# Patient Record
Sex: Female | Born: 1945 | Race: White | Hispanic: No | Marital: Married | Smoking: Never smoker
Health system: Southern US, Community
[De-identification: ages and names within clinical notes are randomized; demographics above are authoritative.]

## PROBLEM LIST (undated history)

## (undated) DIAGNOSIS — T7840XA Allergy, unspecified, initial encounter: Secondary | ICD-10-CM

## (undated) DIAGNOSIS — B019 Varicella without complication: Secondary | ICD-10-CM

## (undated) DIAGNOSIS — H269 Unspecified cataract: Secondary | ICD-10-CM

## (undated) DIAGNOSIS — M1990 Unspecified osteoarthritis, unspecified site: Secondary | ICD-10-CM

## (undated) DIAGNOSIS — E7800 Pure hypercholesterolemia, unspecified: Secondary | ICD-10-CM

## (undated) DIAGNOSIS — R011 Cardiac murmur, unspecified: Secondary | ICD-10-CM

## (undated) DIAGNOSIS — J302 Other seasonal allergic rhinitis: Secondary | ICD-10-CM

## (undated) DIAGNOSIS — I10 Essential (primary) hypertension: Secondary | ICD-10-CM

## (undated) HISTORY — DX: Varicella without complication: B01.9

## (undated) HISTORY — DX: Essential (primary) hypertension: I10

## (undated) HISTORY — PX: AUGMENTATION MAMMAPLASTY: SUR837

## (undated) HISTORY — DX: Pure hypercholesterolemia, unspecified: E78.00

## (undated) HISTORY — DX: Other seasonal allergic rhinitis: J30.2

## (undated) HISTORY — DX: Unspecified cataract: H26.9

## (undated) HISTORY — DX: Cardiac murmur, unspecified: R01.1

## (undated) HISTORY — DX: Unspecified osteoarthritis, unspecified site: M19.90

## (undated) HISTORY — DX: Allergy, unspecified, initial encounter: T78.40XA

---

## 1967-03-23 HISTORY — PX: APPENDECTOMY: SHX54

## 1978-03-22 HISTORY — PX: BREAST ENHANCEMENT SURGERY: SHX7

## 2015-06-02 DIAGNOSIS — E7800 Pure hypercholesterolemia, unspecified: Secondary | ICD-10-CM | POA: Diagnosis not present

## 2015-06-02 DIAGNOSIS — I10 Essential (primary) hypertension: Secondary | ICD-10-CM | POA: Diagnosis not present

## 2015-06-26 DIAGNOSIS — Z1231 Encounter for screening mammogram for malignant neoplasm of breast: Secondary | ICD-10-CM | POA: Diagnosis not present

## 2015-07-07 DIAGNOSIS — D225 Melanocytic nevi of trunk: Secondary | ICD-10-CM | POA: Diagnosis not present

## 2015-08-26 DIAGNOSIS — M170 Bilateral primary osteoarthritis of knee: Secondary | ICD-10-CM | POA: Diagnosis not present

## 2015-12-02 DIAGNOSIS — R5383 Other fatigue: Secondary | ICD-10-CM | POA: Diagnosis not present

## 2015-12-02 DIAGNOSIS — E785 Hyperlipidemia, unspecified: Secondary | ICD-10-CM | POA: Diagnosis not present

## 2015-12-02 DIAGNOSIS — I10 Essential (primary) hypertension: Secondary | ICD-10-CM | POA: Diagnosis not present

## 2015-12-05 DIAGNOSIS — E785 Hyperlipidemia, unspecified: Secondary | ICD-10-CM | POA: Diagnosis not present

## 2015-12-05 DIAGNOSIS — Z0000 Encounter for general adult medical examination without abnormal findings: Secondary | ICD-10-CM | POA: Diagnosis not present

## 2016-01-19 DIAGNOSIS — J019 Acute sinusitis, unspecified: Secondary | ICD-10-CM | POA: Diagnosis not present

## 2016-01-19 DIAGNOSIS — J209 Acute bronchitis, unspecified: Secondary | ICD-10-CM | POA: Diagnosis not present

## 2016-01-28 DIAGNOSIS — H43393 Other vitreous opacities, bilateral: Secondary | ICD-10-CM | POA: Diagnosis not present

## 2016-01-28 DIAGNOSIS — H02831 Dermatochalasis of right upper eyelid: Secondary | ICD-10-CM | POA: Diagnosis not present

## 2016-01-28 DIAGNOSIS — H2513 Age-related nuclear cataract, bilateral: Secondary | ICD-10-CM | POA: Diagnosis not present

## 2016-01-28 DIAGNOSIS — H02834 Dermatochalasis of left upper eyelid: Secondary | ICD-10-CM | POA: Diagnosis not present

## 2016-01-28 DIAGNOSIS — H524 Presbyopia: Secondary | ICD-10-CM | POA: Diagnosis not present

## 2016-01-28 DIAGNOSIS — H52203 Unspecified astigmatism, bilateral: Secondary | ICD-10-CM | POA: Diagnosis not present

## 2016-03-26 DIAGNOSIS — I10 Essential (primary) hypertension: Secondary | ICD-10-CM | POA: Diagnosis not present

## 2016-03-26 DIAGNOSIS — Z23 Encounter for immunization: Secondary | ICD-10-CM | POA: Diagnosis not present

## 2016-05-26 DIAGNOSIS — Z1159 Encounter for screening for other viral diseases: Secondary | ICD-10-CM | POA: Diagnosis not present

## 2016-05-26 DIAGNOSIS — E7800 Pure hypercholesterolemia, unspecified: Secondary | ICD-10-CM | POA: Diagnosis not present

## 2016-05-26 DIAGNOSIS — Z0000 Encounter for general adult medical examination without abnormal findings: Secondary | ICD-10-CM | POA: Diagnosis not present

## 2016-06-01 DIAGNOSIS — I10 Essential (primary) hypertension: Secondary | ICD-10-CM | POA: Diagnosis not present

## 2016-06-01 DIAGNOSIS — Z9189 Other specified personal risk factors, not elsewhere classified: Secondary | ICD-10-CM | POA: Diagnosis not present

## 2016-06-01 DIAGNOSIS — E559 Vitamin D deficiency, unspecified: Secondary | ICD-10-CM | POA: Diagnosis not present

## 2016-06-01 DIAGNOSIS — E7800 Pure hypercholesterolemia, unspecified: Secondary | ICD-10-CM | POA: Diagnosis not present

## 2016-06-01 DIAGNOSIS — R739 Hyperglycemia, unspecified: Secondary | ICD-10-CM | POA: Diagnosis not present

## 2016-06-15 DIAGNOSIS — E559 Vitamin D deficiency, unspecified: Secondary | ICD-10-CM | POA: Diagnosis not present

## 2016-06-15 DIAGNOSIS — Z9189 Other specified personal risk factors, not elsewhere classified: Secondary | ICD-10-CM | POA: Diagnosis not present

## 2016-06-15 DIAGNOSIS — Z780 Asymptomatic menopausal state: Secondary | ICD-10-CM | POA: Diagnosis not present

## 2016-08-15 DIAGNOSIS — J209 Acute bronchitis, unspecified: Secondary | ICD-10-CM | POA: Diagnosis not present

## 2016-08-31 DIAGNOSIS — E7800 Pure hypercholesterolemia, unspecified: Secondary | ICD-10-CM | POA: Diagnosis not present

## 2016-08-31 DIAGNOSIS — I10 Essential (primary) hypertension: Secondary | ICD-10-CM | POA: Diagnosis not present

## 2016-08-31 DIAGNOSIS — R739 Hyperglycemia, unspecified: Secondary | ICD-10-CM | POA: Diagnosis not present

## 2016-09-07 DIAGNOSIS — I10 Essential (primary) hypertension: Secondary | ICD-10-CM | POA: Diagnosis not present

## 2016-09-07 DIAGNOSIS — E7800 Pure hypercholesterolemia, unspecified: Secondary | ICD-10-CM | POA: Diagnosis not present

## 2017-02-19 NOTE — Progress Notes (Addendum)
[facility] [address], [address] [place], [place] [id number] [phone] [phone]  Date:  02/21/2017   Name:  Jennifer Gonzalez   DOB:  [birth year]   MRN:  [id number]  PCP:  Pearline Cablesopland, Jessica C, MD    Chief Complaint: Establish Care (Pt here to est care. )   History of Present Illness:  Jennifer Gellnna Christensen is a 71 y.o. 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  She developed natural immunity.   She has been tested for Hep C and was negative  mammo- she is scheduled for tomorrow She declines colon cancer screening- she has never had this done  She did do a bone density last year- looked ok  She thinks her last tetanus was likely 10 years ago- she might like to update today She did do her pneumonia series already   She does not see GYN any longer Never had an abnl pap No post- menopausal bleeding   She has noted her BP creeping She does check her BP at home and may run 120s/ 70s  She last had labs in June- so far today she ate an egg and a piece of baccon  She has lost about 60 lbs by going low carb and kept it off for the last 6 years.    Her main problem is OA- in both knees and her feet. More in the left foot   Patient Active Problem List   Diagnosis Date Noted  . Essential hypertension 02/21/2017  . Mixed hyperlipidemia 02/21/2017   . Primary osteoarthritis involving multiple joints 02/21/2017    No past medical history on file.    Social History   Tobacco Use  . Smoking status: Not on file  Substance Use Topics  . Alcohol use: Not on file  . Drug use: Not on file    No family history on file.  Allergies  Allergen Reactions  . Amlodipine     Dizziness     Medication list has been reviewed and updated.  Current Outpatient Medications on File Prior to Visit  Medication Sig Dispense Refill  . Ascorbic Acid (VITAMIN C) 1000 MG tablet Take 1,000 mg by mouth.    . chlorpheniramine (CHLOR-TRIMETON) 4 MG tablet Take 1 tablet by mouth 2 (two) times daily.     No current facility-administered medications on file prior to visit.     Review of Systems:  As per HPI- otherwise negative. No fever or chills No CP or SOB   Physical Examination: Vitals:   02/21/17 1049  BP: (!) 142/84  Pulse: 91  Temp: 97.9 F (36.6 C)  SpO2: 98%   Vitals:   02/21/17 1049  Weight: 163 lb 9.6 oz (74.2 kg)  Height: 5\' 5"  (  1.651 m)   Body mass index is 27.22 kg/m. Ideal Body Weight: Weight in (lb) to have BMI = 25: 149.9  GEN: WDWN, NAD, Non-toxic, A & O x 3, overweight, looks well  HEENT: Atraumatic, Normocephalic. Neck supple. No masses, No LAD. Bilateral TM wnl, oropharynx normal.  PEERL,EOMI.   Ears and Nose: No external deformity. CV: RRR, No M/G/R. No JVD. No thrill. No extra heart sounds. PULM: CTA B, no wheezes, crackles, rhonchi. No retractions. No resp. distress. No accessory muscle use. ABD: S, NT, ND, +BS. No rebound. No HSM. EXTR: No c/c/e NEURO Normal gait.  PSYCH: Normally interactive. Conversant. Not depressed or anxious appearing.  Calm demeanor.  Bilateral knees display crepitus   Assessment and Plan: Essential hypertension - Plan: losartan (COZAAR) 100 MG tablet, CBC, Comprehensive metabolic panel  Mixed hyperlipidemia - Plan: pravastatin (PRAVACHOL) 20 MG tablet, Lipid  panel  Medication monitoring encounter - Plan: CBC, Comprehensive metabolic panel  Primary osteoarthritis involving multiple joints  Here today to establish care and go over her chronic health condition She will monitor her BP at home and keep me posted about this For the time being she is ok managing her OA, but will let me know if anything further is desired Refilled medications today Will plan further follow- up pending labs.   Signed Abbe AmsterdamJessica Copland, MD Received her labs 12/4  Blood count is normal Your cholesterol is overall good- although your total is a bit high, you have a good HDL to balance it out My only real concern is that your potassium is a bit high.  This may be due to blood processing in the lab, but I would like to recheck it and make sure it comes back to normal. I will order a repeat potassium for you as a lab visit only- please come by for a blood draw at your convenience in the next week or so.    Otherwise please keep me posted about your blood pressure, and let's visit in about 6 months JC  Results for orders placed or performed in visit on 02/21/17  CBC  Result Value Ref Range   WBC 4.5 4.0 - 10.5 K/uL   RBC 3.94 3.87 - 5.11 Mil/uL   Platelets 233.0 150.0 - 400.0 K/uL   Hemoglobin 12.7 12.0 - 15.0 g/dL   HCT 16.138.4 09.636.0 - 04.546.0 %   MCV 97.5 78.0 - 100.0 fl   MCHC 33.2 30.0 - 36.0 g/dL   RDW 40.912.5 81.111.5 - 91.415.5 %  Comprehensive metabolic panel  Result Value Ref Range   Sodium 141 135 - 145 mEq/L   Potassium 5.5 (H) 3.5 - 5.1 mEq/L   Chloride 105 96 - 112 mEq/L   CO2 27 19 - 32 mEq/L   Glucose, Bld 106 (H) 70 - 99 mg/dL   BUN 25 (H) 6 - 23 mg/dL   Creatinine, Ser 7.820.94 0.40 - 1.20 mg/dL   Total Bilirubin 0.4 0.2 - 1.2 mg/dL   Alkaline Phosphatase 49 39 - 117 U/L   AST 25 0 - 37 U/L   ALT 28 0 - 35 U/L   Total Protein 6.9 6.0 - 8.3 g/dL   Albumin 4.5 3.5 - 5.2 g/dL   Calcium 95.610.5 8.4 - 21.310.5 mg/dL   GFR 08.6562.18 >78.46>60.00 mL/min  Lipid panel  Result Value  Ref Range   Cholesterol 221 (H) 0 - 200 mg/dL   Triglycerides 962.9129.0 0.0 - 149.0 mg/dL   HDL 52.8480.10 >13.24>39.00 mg/dL   VLDL 25.8  0.0 - 40.0 mg/dL   LDL Cholesterol 161 (H) 0 - 99 mg/dL   Total CHOL/HDL Ratio 3    NonHDL 140.69

## 2017-02-21 ENCOUNTER — Ambulatory Visit (INDEPENDENT_AMBULATORY_CARE_PROVIDER_SITE_OTHER): Payer: Medicare Other | Admitting: Family Medicine

## 2017-02-21 ENCOUNTER — Encounter: Payer: Self-pay | Admitting: Family Medicine

## 2017-02-21 VITALS — BP 142/84 | HR 91 | Temp 97.9°F | Ht 65.0 in | Wt 163.6 lb

## 2017-02-21 DIAGNOSIS — E782 Mixed hyperlipidemia: Secondary | ICD-10-CM

## 2017-02-21 DIAGNOSIS — M159 Polyosteoarthritis, unspecified: Secondary | ICD-10-CM | POA: Insufficient documentation

## 2017-02-21 DIAGNOSIS — M150 Primary generalized (osteo)arthritis: Secondary | ICD-10-CM | POA: Diagnosis not present

## 2017-02-21 DIAGNOSIS — I10 Essential (primary) hypertension: Secondary | ICD-10-CM

## 2017-02-21 DIAGNOSIS — E875 Hyperkalemia: Secondary | ICD-10-CM

## 2017-02-21 DIAGNOSIS — Z5181 Encounter for therapeutic drug level monitoring: Secondary | ICD-10-CM

## 2017-02-21 LAB — CBC
HEMATOCRIT: 38.4 % (ref 36.0–46.0)
Hemoglobin: 12.7 g/dL (ref 12.0–15.0)
MCHC: 33.2 g/dL (ref 30.0–36.0)
MCV: 97.5 fl (ref 78.0–100.0)
Platelets: 233 10*3/uL (ref 150.0–400.0)
RBC: 3.94 Mil/uL (ref 3.87–5.11)
RDW: 12.5 % (ref 11.5–15.5)
WBC: 4.5 10*3/uL (ref 4.0–10.5)

## 2017-02-21 MED ORDER — PRAVASTATIN SODIUM 20 MG PO TABS: 20.0000 mg | ORAL_TABLET | Freq: Every day | ORAL | 3 refills | Status: DC

## 2017-02-21 MED ORDER — LOSARTAN POTASSIUM 100 MG PO TABS: 100.0000 mg | ORAL_TABLET | Freq: Every day | ORAL | 3 refills | Status: DC

## 2017-02-21 NOTE — Patient Instructions (Addendum)
It was very nice to see you today!  Take care and please do keep me posted about your BP readings at home.  Please stick to 100mg  of losartan/ day; if need be we can add back a bit of HCTZ If you have any significant wound you will need a tetanus booster  I will be in touch with your labs asap

## 2017-02-22 ENCOUNTER — Encounter: Payer: Self-pay | Admitting: Family Medicine

## 2017-02-22 ENCOUNTER — Encounter: Payer: Self-pay | Admitting: Emergency Medicine

## 2017-02-22 DIAGNOSIS — Z1231 Encounter for screening mammogram for malignant neoplasm of breast: Secondary | ICD-10-CM | POA: Diagnosis not present

## 2017-02-22 LAB — COMPREHENSIVE METABOLIC PANEL WITH GFR
ALT: 28 U/L (ref 0–35)
AST: 25 U/L (ref 0–37)
Albumin: 4.5 g/dL (ref 3.5–5.2)
Alkaline Phosphatase: 49 U/L (ref 39–117)
BUN: 25 mg/dL — ABNORMAL HIGH (ref 6–23)
CO2: 27 meq/L (ref 19–32)
Calcium: 10.5 mg/dL (ref 8.4–10.5)
Chloride: 105 meq/L (ref 96–112)
Creatinine, Ser: 0.94 mg/dL (ref 0.40–1.20)
GFR: 62.18 mL/min
Glucose, Bld: 106 mg/dL — ABNORMAL HIGH (ref 70–99)
Potassium: 5.5 meq/L — ABNORMAL HIGH (ref 3.5–5.1)
Sodium: 141 meq/L (ref 135–145)
Total Bilirubin: 0.4 mg/dL (ref 0.2–1.2)
Total Protein: 6.9 g/dL (ref 6.0–8.3)

## 2017-02-22 LAB — LIPID PANEL
Cholesterol: 221 mg/dL — ABNORMAL HIGH (ref 0–200)
HDL: 80.1 mg/dL
LDL Cholesterol: 115 mg/dL — ABNORMAL HIGH (ref 0–99)
NonHDL: 140.69
Total CHOL/HDL Ratio: 3
Triglycerides: 129 mg/dL (ref 0.0–149.0)
VLDL: 25.8 mg/dL (ref 0.0–40.0)

## 2017-02-22 NOTE — Addendum Note (Signed)
Addended by: Abbe AmsterdamOPLAND, Iveliz Garay C on: 02/22/2017 12:19 PM   Modules accepted: Orders

## 2017-02-22 NOTE — Addendum Note (Signed)
Addended by: Cammy CopaHANDLER, TANESHA N on: 02/22/2017 10:21 AM   Modules accepted: Orders

## 2017-03-08 ENCOUNTER — Other Ambulatory Visit (INDEPENDENT_AMBULATORY_CARE_PROVIDER_SITE_OTHER): Payer: Medicare Other

## 2017-03-08 DIAGNOSIS — E875 Hyperkalemia: Secondary | ICD-10-CM | POA: Diagnosis not present

## 2017-03-08 LAB — BASIC METABOLIC PANEL
BUN: 25 mg/dL — ABNORMAL HIGH (ref 6–23)
CO2: 28 meq/L (ref 19–32)
Calcium: 9.9 mg/dL (ref 8.4–10.5)
Chloride: 104 mEq/L (ref 96–112)
Creatinine, Ser: 0.78 mg/dL (ref 0.40–1.20)
GFR: 77.11 mL/min (ref >60.00)
GLUCOSE: 100 mg/dL — AB (ref 70–99)
POTASSIUM: 4.5 meq/L (ref 3.5–5.1)
SODIUM: 141 meq/L (ref 135–145)

## 2017-03-09 ENCOUNTER — Encounter: Payer: Self-pay | Admitting: Family Medicine

## 2017-04-13 ENCOUNTER — Encounter: Payer: Self-pay | Admitting: Family Medicine

## 2017-04-13 MED ORDER — LOSARTAN POTASSIUM-HCTZ 100-12.5 MG PO TABS: 1.0000 | ORAL_TABLET | Freq: Every day | ORAL | 3 refills | Status: DC

## 2017-10-18 ENCOUNTER — Encounter: Payer: Self-pay | Admitting: Family Medicine

## 2017-10-24 DIAGNOSIS — Z0000 Encounter for general adult medical examination without abnormal findings: Secondary | ICD-10-CM | POA: Diagnosis not present

## 2017-10-26 LAB — FECAL OCCULT BLOOD, GUAIAC: Fecal Occult Blood: NEGATIVE

## 2017-11-08 ENCOUNTER — Encounter: Payer: Self-pay | Admitting: Family Medicine

## 2017-11-17 DIAGNOSIS — G8929 Other chronic pain: Secondary | ICD-10-CM | POA: Diagnosis not present

## 2017-11-17 DIAGNOSIS — M19072 Primary osteoarthritis, left ankle and foot: Secondary | ICD-10-CM | POA: Diagnosis not present

## 2017-11-17 DIAGNOSIS — M79672 Pain in left foot: Secondary | ICD-10-CM | POA: Diagnosis not present

## 2018-01-11 DIAGNOSIS — H43393 Other vitreous opacities, bilateral: Secondary | ICD-10-CM | POA: Diagnosis not present

## 2018-01-11 DIAGNOSIS — H2513 Age-related nuclear cataract, bilateral: Secondary | ICD-10-CM | POA: Diagnosis not present

## 2018-01-11 DIAGNOSIS — H52203 Unspecified astigmatism, bilateral: Secondary | ICD-10-CM | POA: Diagnosis not present

## 2018-01-11 DIAGNOSIS — H5213 Myopia, bilateral: Secondary | ICD-10-CM | POA: Diagnosis not present

## 2018-02-07 ENCOUNTER — Telehealth: Payer: Self-pay

## 2018-02-07 DIAGNOSIS — Z1231 Encounter for screening mammogram for malignant neoplasm of breast: Secondary | ICD-10-CM

## 2018-02-07 NOTE — Telephone Encounter (Signed)
Copied from CRM [id number]. Topic: General - Other >> Feb 07, 2018  3:51 PM Terisa Starraylor, Brittany L wrote: Reason for CRM: Patient is requesting to have a mammogram done. [facility]. She is requesting to have a 3D since she has breast implants.

## 2018-02-07 NOTE — Telephone Encounter (Signed)
Mammogram ordered

## 2018-02-28 ENCOUNTER — Ambulatory Visit (HOSPITAL_BASED_OUTPATIENT_CLINIC_OR_DEPARTMENT_OTHER)
Admission: RE | Admit: 2018-02-28 | Discharge: 2018-02-28 | Disposition: A | Discharge status: Home / Self Care | Payer: Medicare Other | Source: Ambulatory Visit | Attending: Family Medicine | Admitting: Family Medicine

## 2018-02-28 ENCOUNTER — Encounter (HOSPITAL_BASED_OUTPATIENT_CLINIC_OR_DEPARTMENT_OTHER): Payer: Self-pay | Admitting: Radiology

## 2018-02-28 DIAGNOSIS — Z1231 Encounter for screening mammogram for malignant neoplasm of breast: Secondary | ICD-10-CM | POA: Insufficient documentation

## 2018-03-04 NOTE — Progress Notes (Addendum)
[facility] at [facility] [address] [place], [place] [address] [phone]  Date:  03/08/2018   Name:  Jennifer Gonzalez   DOB:  1945/05/16   MRN:  [id number]  PCP:  Pearline Cables, MD    Chief Complaint: Annual Exam (declines flu shot)   History of Present Illness:  Jennifer Gonzalez is a 72 y.o. very pleasant female patient who presents with the following:  Here today for a physical-however will not code as such as she has Medicare.  Jennifer Gonzalez is generally in good health, with history of hypertension, hyperlipidemia, and arthritis.  She is taking ibuprofen BID generally, may take TID on occasion She did see podiatry in August; she dx severe OA in her left foot. This is her main arthritis issue, as well as her neck I last saw her about 1 year ago She has been monitoring her blood pressure, and is treated with Hyzaar  Labs:a year ago, fasting today  Mammo: 12/19 Colon: did cologuard, it was negative.  Did this past summer per her report.  I do not have a copy of this result, but she reports it was negative. Dexa: done per her last doc, 3/18- normal bone density  Hep C screening needed with labs today Immun:  Declines flu, shingles- she has thought about getting this but cost has held her back  She is looking forward to a quiet holiday, and plans to spend time with her 7-year-old great grandson.  She exercises regularly, mostly by walking.  No problems with chest pain or shortness of breath.  Patient Active Problem List   Diagnosis Date Noted  . Essential hypertension 02/21/2017  . Mixed hyperlipidemia 02/21/2017  . 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 Arthritis   . Chicken pox   . High cholesterol   . Hypertension   . Seasonal allergies     Past Surgical History:  Procedure Laterality Date  . APPENDECTOMY  1969  . AUGMENTATION MAMMAPLASTY    . BREAST  ENHANCEMENT SURGERY  1980  . CESAREAN SECTION  1969    Social History   Tobacco Use  . Smoking status: Never Smoker  . Smokeless tobacco: Never Used  Substance Use Topics  . Alcohol use: Yes  . Drug use: No    Family History  Problem Relation Age of Onset  . Cancer Mother   . Early death Mother   . Hypertension Mother   . Alcohol abuse Father   . Arthritis Father   . Asthma Father   . COPD Father   . Heart disease Father   . Hypertension Father   . Asthma Daughter     Allergies  Allergen Reactions  . Amlodipine     Dizziness   . Aspirin Tinitus  . Citrullus Vulgaris Itching  . Clindamycin Other (See Comments)    Esophageal inflammation    . Codeine Other (See Comments)    Hallucinations    . Diphenhydramine Hcl (Sleep) Other (See Comments)    unknown unknown   . Loratadine Other (See Comments)    unknown   . Metronidazole Itching  . Penicillins Itching  . Pineapple Itching  . Tramadol Other (See Comments)    woozie feeling     Medication list has been reviewed and updated.  Current Outpatient Medications on File Prior to Visit  Medication Sig Dispense Refill  . Ascorbic Acid (VITAMIN C)  1000 MG tablet Take 1,000 mg by mouth.    . chlorpheniramine (CHLOR-TRIMETON) 4 MG tablet Take 1 tablet by mouth 2 (two) times daily.    . Cholecalciferol (VITAMIN D3) 2000 units TABS Take 1 tablet by mouth daily.    Marland Kitchen. ibuprofen (ADVIL,MOTRIN) 200 MG tablet Take 200 mg by mouth 4 (four) times daily. 2 in morning, 2 in evening    . Magnesium Citrate 125 MG CAPS Take 1 capsule by mouth daily.    . Multiple Vitamin (MULTIVITAMIN) capsule Take 1 capsule by mouth daily.    Marland Kitchen. OVER THE COUNTER MEDICATION Take 1,000 mg by mouth 2 (two) times daily. TURMERIC     . OVER THE COUNTER MEDICATION Take 750 mg by mouth daily. Mega Red    . Probiotic CAPS Take 1 capsule by mouth daily.     No current facility-administered medications on file prior to visit.     Review of  Systems:  As per HPI- otherwise negative. No fever or chills.  No chest pain or shortness of breath.  Notes no postmenopausal bleeding.  Physical Examination: Vitals:   03/08/18 1011 03/08/18 1038  BP: 136/80   Pulse: (!) 103 90  Resp: 16   Temp: 98.4 F (36.9 C)   SpO2: 99%    Vitals:   03/08/18 1011  Weight: 160 lb (72.6 kg)  Height: 5\' 5"  (1.651 m)   Body mass index is 26.63 kg/m. Ideal Body Weight: Weight in (lb) to have BMI = 25: 149.9  GEN: WDWN, NAD, Non-toxic, A & O x 3, looks well and younger than age  HEENT: Atraumatic, Normocephalic. Neck supple. No masses, No LAD.  Bilateral TM wnl, oropharynx normal.  PEERL,EOMI.   Ears and Nose: No external deformity. CV: RRR, No M/G/R. No JVD. No thrill. No extra heart sounds. PULM: CTA B, no wheezes, crackles, rhonchi. No retractions. No resp. distress. No accessory muscle use. ABD: S, NT, ND, +BS. No rebound. No HSM. EXTR: No c/c/e NEURO Normal gait.  PSYCH: Normally interactive. Conversant. Not depressed or anxious appearing.  Calm demeanor.    Assessment and Plan: Essential hypertension - Plan: CBC, Comprehensive metabolic panel, losartan-hydrochlorothiazide (HYZAAR) 100-12.5 MG tablet  Mixed hyperlipidemia - Plan: Lipid panel, pravastatin (PRAVACHOL) 20 MG tablet  Elevated glucose - Plan: Hemoglobin A1c  Encounter for hepatitis C screening test for low risk patient - Plan: Hepatitis C antibody  Primary osteoarthritis of both feet - Plan: ibuprofen (ADVIL,MOTRIN) 200 MG tablet  Complete physical exam today.  Overall Jennifer Gonzalez is doing very well, and feels like she is in great shape for her age.  We will continue her Hyzaar and Pravachol.  Lab plan as above.  Check hepatitis C antibody today.  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  However she reports that she cleared this infection naturally. Suggested that she look into  having Shingrix done at her drugstore Will plan further follow- up pending labs.  Signed Abbe AmsterdamJessica Mervin Ramires, MD  Received her labs, message to pt  Results for orders placed or performed in visit on 03/08/18  CBC  Result Value Ref Range   WBC 4.7 4.0 - 10.5 K/uL   RBC 4.02 3.87 - 5.11 Mil/uL   Platelets 252.0 150.0 - 400.0 K/uL   Hemoglobin 13.0 12.0 - 15.0 g/dL   HCT 46.938.8 62.936.0 - 52.846.0 %   MCV 96.6 78.0 - 100.0 fl  MCHC 33.6 30.0 - 36.0 g/dL   RDW 04.5 40.9 - 81.1 %  Comprehensive metabolic panel  Result Value Ref Range   Sodium 141 135 - 145 mEq/L   Potassium 4.9 3.5 - 5.1 mEq/L   Chloride 102 96 - 112 mEq/L   CO2 28 19 - 32 mEq/L   Glucose, Bld 112 (H) 70 - 99 mg/dL   BUN 25 (H) 6 - 23 mg/dL   Creatinine, Ser 9.14 0.40 - 1.20 mg/dL   Total Bilirubin 0.5 0.2 - 1.2 mg/dL   Alkaline Phosphatase 54 39 - 117 U/L   AST 25 0 - 37 U/L   ALT 28 0 - 35 U/L   Total Protein 6.9 6.0 - 8.3 g/dL   Albumin 4.7 3.5 - 5.2 g/dL   Calcium 78.2 8.4 - 95.6 mg/dL   GFR 21.30 (L) >86.57 mL/min  Hemoglobin A1c  Result Value Ref Range   Hgb A1c MFr Bld 5.4 4.6 - 6.5 %  Lipid panel  Result Value Ref Range   Cholesterol 224 (H) 0 - 200 mg/dL   Triglycerides 846.9 0.0 - 149.0 mg/dL   HDL 62.95 >28.41 mg/dL   VLDL 32.4 0.0 - 40.1 mg/dL   LDL Cholesterol 027 (H) 0 - 99 mg/dL   Total CHOL/HDL Ratio 3    NonHDL 135.97    The 10-year ASCVD risk score Denman George DC Jr., et al., 2013) is: 19%   Values used to calculate the score:     Age: 89 years     Sex: Female     Is Non-Hispanic African American: No     Diabetic: No     Tobacco smoker: No     Systolic Blood Pressure: 136 mmHg     Is BP treated: Yes     HDL Cholesterol: 87.9 mg/dL     Total Cholesterol: 224 mg/dL

## 2018-03-08 ENCOUNTER — Encounter: Payer: Self-pay | Admitting: Family Medicine

## 2018-03-08 ENCOUNTER — Ambulatory Visit (INDEPENDENT_AMBULATORY_CARE_PROVIDER_SITE_OTHER): Payer: Medicare Other | Admitting: Family Medicine

## 2018-03-08 VITALS — BP 136/80 | HR 90 | Temp 98.4°F | Resp 16 | Ht 65.0 in | Wt 160.0 lb

## 2018-03-08 DIAGNOSIS — E782 Mixed hyperlipidemia: Secondary | ICD-10-CM | POA: Diagnosis not present

## 2018-03-08 DIAGNOSIS — M19071 Primary osteoarthritis, right ankle and foot: Secondary | ICD-10-CM

## 2018-03-08 DIAGNOSIS — Z0001 Encounter for general adult medical examination with abnormal findings: Secondary | ICD-10-CM

## 2018-03-08 DIAGNOSIS — I10 Essential (primary) hypertension: Secondary | ICD-10-CM | POA: Diagnosis not present

## 2018-03-08 DIAGNOSIS — Z1159 Encounter for screening for other viral diseases: Secondary | ICD-10-CM

## 2018-03-08 DIAGNOSIS — R7309 Other abnormal glucose: Secondary | ICD-10-CM | POA: Diagnosis not present

## 2018-03-08 DIAGNOSIS — M19072 Primary osteoarthritis, left ankle and foot: Secondary | ICD-10-CM

## 2018-03-08 LAB — COMPREHENSIVE METABOLIC PANEL
ALBUMIN: 4.7 g/dL (ref 3.5–5.2)
ALK PHOS: 54 U/L (ref 39–117)
ALT: 28 U/L (ref 0–35)
AST: 25 U/L (ref 0–37)
BUN: 25 mg/dL — ABNORMAL HIGH (ref 6–23)
CALCIUM: 10 mg/dL (ref 8.4–10.5)
CHLORIDE: 102 meq/L (ref 96–112)
CO2: 28 mEq/L (ref 19–32)
CREATININE: 0.99 mg/dL (ref 0.40–1.20)
GFR: 58.4 mL/min — ABNORMAL LOW (ref >60.00)
Glucose, Bld: 112 mg/dL — ABNORMAL HIGH (ref 70–99)
Potassium: 4.9 mEq/L (ref 3.5–5.1)
Sodium: 141 mEq/L (ref 135–145)
Total Bilirubin: 0.5 mg/dL (ref 0.2–1.2)
Total Protein: 6.9 g/dL (ref 6.0–8.3)

## 2018-03-08 LAB — LIPID PANEL
CHOLESTEROL: 224 mg/dL — AB (ref 0–200)
HDL: 87.9 mg/dL (ref >39.00)
LDL Cholesterol: 111 mg/dL — ABNORMAL HIGH (ref 0–99)
NonHDL: 135.97
Total CHOL/HDL Ratio: 3
Triglycerides: 126 mg/dL (ref 0.0–149.0)
VLDL: 25.2 mg/dL (ref 0.0–40.0)

## 2018-03-08 LAB — CBC
HEMATOCRIT: 38.8 % (ref 36.0–46.0)
Hemoglobin: 13 g/dL (ref 12.0–15.0)
MCHC: 33.6 g/dL (ref 30.0–36.0)
MCV: 96.6 fl (ref 78.0–100.0)
PLATELETS: 252 10*3/uL (ref 150.0–400.0)
RBC: 4.02 Mil/uL (ref 3.87–5.11)
RDW: 12.3 % (ref 11.5–15.5)
WBC: 4.7 10*3/uL (ref 4.0–10.5)

## 2018-03-08 LAB — HEMOGLOBIN A1C: HEMOGLOBIN A1C: 5.4 % (ref 4.6–6.5)

## 2018-03-08 MED ORDER — PRAVASTATIN SODIUM 20 MG PO TABS: 20.0000 mg | ORAL_TABLET | Freq: Every day | ORAL | 3 refills | Status: DC

## 2018-03-08 MED ORDER — LOSARTAN POTASSIUM-HCTZ 100-12.5 MG PO TABS: 1.0000 | ORAL_TABLET | Freq: Every day | ORAL | 3 refills | Status: DC

## 2018-03-08 NOTE — Patient Instructions (Addendum)
It was great to see you today- take care and I will be in touch with your labs asap You are doing wonderful work taking care of yourself   You might be able to get shingrix paid for if given at the drug store- perhaps ask your pharmacist at [facility]    Health Maintenance After Age 73 After age 73, you are at a higher risk for certain long-term diseases and infections as well as injuries from falls. Falls are a major cause of broken bones and head injuries in people who are older than age 73. Getting regular preventive care can help to keep you healthy and well. Preventive care includes getting regular testing and making lifestyle changes as recommended by your health care provider. Talk with your health care provider about:  Which screenings and tests you should have. A screening is a test that checks for a disease when you have no symptoms.  A diet and exercise plan that is right for you. What should I know about screenings and tests to prevent falls? Screening and testing are the best ways to find a health problem early. Early diagnosis and treatment give you the best chance of managing medical conditions that are common after age 73. Certain conditions and lifestyle choices may make you more likely to have a fall. Your health care provider may recommend:  Regular vision checks. Poor vision and conditions such as cataracts can make you more likely to have a fall. If you wear glasses, make sure to get your prescription updated if your vision changes.  Medicine review. Work with your health care provider to regularly review all of the medicines you are taking, including over-the-counter medicines. Ask your health care provider about any side effects that may make you more likely to have a fall. Tell your health care provider if any medicines that you take make you feel dizzy or sleepy.  Osteoporosis screening. Osteoporosis is a condition that causes the bones to get weaker. This can make the  bones weak and cause them to break more easily.  Blood pressure screening. Blood pressure changes and medicines to control blood pressure can make you feel dizzy.  Strength and balance checks. Your health care provider may recommend certain tests to check your strength and balance while standing, walking, or changing positions.  Foot health exam. Foot pain and numbness, as well as not wearing proper footwear, can make you more likely to have a fall.  Depression screening. You may be more likely to have a fall if you have a fear of falling, feel emotionally low, or feel unable to do activities that you used to do.  Alcohol use screening. Using too much alcohol can affect your balance and may make you more likely to have a fall. What actions can I take to lower my risk of falls? General instructions  Talk with your health care provider about your risks for falling. Tell your health care provider if: ? You fall. Be sure to tell your health care provider about all falls, even ones that seem minor. ? You feel dizzy, sleepy, or off-balance.  Take over-the-counter and prescription medicines only as told by your health care provider. These include any supplements.  Eat a healthy diet and maintain a healthy weight. A healthy diet includes low-fat dairy products, low-fat (lean) meats, and fiber from whole grains, beans, and lots of fruits and vegetables. Home safety  Remove any tripping hazards, such as rugs, cords, and clutter.  Install safety equipment  such as grab bars in bathrooms and safety rails on stairs.  Keep rooms and walkways well-lit. Activity   Follow a regular exercise program to stay fit. This will help you maintain your balance. Ask your health care provider what types of exercise are appropriate for you.  If you need a cane or walker, use it as recommended by your health care provider.  Wear supportive shoes that have nonskid soles. Lifestyle  Do not drink alcohol if your  health care provider tells you not to drink.  If you drink alcohol, limit how much you have: ? 0-1 drink a day for women. ? 0-2 drinks a day for men.  Be aware of how much alcohol is in your drink. In the [place] one drink equals one typical bottle of beer (12 oz), one-half glass of wine (5 oz), or one shot of hard liquor (1 oz).  Do not use any products that contain nicotine or tobacco, such as cigarettes and e-cigarettes. If you need help quitting, ask your health care provider. Summary  Having a healthy lifestyle and getting preventive care can help to protect your health and wellness after age 16.  Screening and testing are the best way to find a health problem early and help you avoid having a fall. Early diagnosis and treatment give you the best chance for managing medical conditions that are more common for people who are older than age 21.  Falls are a major cause of broken bones and head injuries in people who are older than age 58. Take precautions to prevent a fall at home.  Work with your health care provider to learn what changes you can make to improve your health and wellness and to prevent falls. This information is not intended to replace advice given to you by your health care provider. Make sure you discuss any questions you have with your health care provider. Document Released: 01/19/2017 Document Revised: 01/19/2017 Document Reviewed: 01/19/2017 Elsevier Interactive Patient Education  2019 [employer].

## 2018-03-09 LAB — HEPATITIS C ANTIBODY
HEP C AB: NONREACTIVE
SIGNAL TO CUT-OFF: 0.01 (ref <1.00)

## 2018-04-17 ENCOUNTER — Other Ambulatory Visit: Payer: Self-pay | Admitting: Family Medicine

## 2018-04-17 DIAGNOSIS — I10 Essential (primary) hypertension: Secondary | ICD-10-CM

## 2018-05-12 ENCOUNTER — Encounter: Payer: Self-pay | Admitting: Family Medicine

## 2018-05-13 ENCOUNTER — Other Ambulatory Visit: Payer: Self-pay | Admitting: Family Medicine

## 2018-05-13 DIAGNOSIS — M2550 Pain in unspecified joint: Secondary | ICD-10-CM

## 2018-05-25 ENCOUNTER — Encounter: Payer: Self-pay | Admitting: Family Medicine

## 2019-01-05 NOTE — Progress Notes (Deleted)
[facility] at [facility] [address] [place], [place] [address] [phone]  Date:  01/08/2019   Name:  Jennifer Gonzalez   DOB:  1946/01/24   MRN:  [id number]  PCP:  Pearline Cables, MD    Chief Complaint: No chief complaint on file.   History of Present Illness:  Jennifer Gonzalez is a 73 y.o. very pleasant female patient who presents with the following:  Following up today to recheck kidney function, and discuss concerns about thyroid Patient with history of hypertension, dyslipidemia I last saw her in the office in December 2019  She is a retired Engineer, civil (consulting), enjoys walking for exercise Married to [place]  She had a mild dip in her GFR in [place] will recheck today She also has symptoms which she thinks may related to her thyroid-she describes  Flu shot Colon cancer screening Can suggest Shingrix Patient Active Problem List   Diagnosis Date Noted  . Essential hypertension 02/21/2017  . Mixed hyperlipidemia 02/21/2017  . 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 Arthritis   . Chicken pox   . High cholesterol   . Hypertension   . Seasonal allergies     Past Surgical History:  Procedure Laterality Date  . APPENDECTOMY  1969  . AUGMENTATION MAMMAPLASTY    . BREAST ENHANCEMENT SURGERY  1980  . CESAREAN SECTION  1969    Social History   Tobacco Use  . Smoking status: Never Smoker  . Smokeless tobacco: Never Used  Substance Use Topics  . Alcohol use: Yes  . Drug use: No    Family History  Problem Relation Age of Onset  . Cancer Mother   . Early death Mother   . Hypertension Mother   . Alcohol abuse Father   . Arthritis Father   . Asthma Father   . COPD Father   . Heart disease Father   . Hypertension Father   . Asthma Daughter     Allergies  Allergen Reactions  . Amlodipine     Dizziness   . Aspirin Tinitus  . Citrullus Vulgaris  Itching  . Clindamycin Other (See Comments)    Esophageal inflammation    . Codeine Other (See Comments)    Hallucinations    . Diphenhydramine Hcl (Sleep) Other (See Comments)    unknown unknown   . Loratadine Other (See Comments)    unknown   . Metronidazole Itching  . Penicillins Itching  . Pineapple Itching  . Tramadol Other (See Comments)    woozie feeling     Medication list has been reviewed and updated.  Current Outpatient Medications on File Prior to Visit  Medication Sig Dispense Refill  . Ascorbic Acid (VITAMIN C) 1000 MG tablet Take 1,000 mg by mouth.    . chlorpheniramine (CHLOR-TRIMETON) 4 MG tablet Take 1 tablet by mouth 2 (two) times daily.    . Cholecalciferol (VITAMIN D3) 2000 units TABS Take 1 tablet by mouth daily.    Marland Kitchen ibuprofen (ADVIL,MOTRIN) 200 MG tablet Take 200 mg by mouth 4 (four) times daily. 2 in morning, 2 in evening    . losartan-hydrochlorothiazide (HYZAAR) 100-12.5 MG tablet TAKE ONE (1) TABLET BY MOUTH EVERY DAY 90 tablet 3  . Magnesium Citrate 125 MG CAPS Take 1 capsule by mouth daily.    . Multiple Vitamin (MULTIVITAMIN) capsule Take 1 capsule by mouth daily.    Marland Kitchen OVER THE  COUNTER MEDICATION Take 1,000 mg by mouth 2 (two) times daily. TURMERIC     . OVER THE COUNTER MEDICATION Take 750 mg by mouth daily. Mega Red    . pravastatin (PRAVACHOL) 20 MG tablet Take 1 tablet (20 mg total) by mouth daily. 90 tablet 3  . Probiotic CAPS Take 1 capsule by mouth daily.     No current facility-administered medications on file prior to visit.     Review of Systems:  As per HPI- otherwise negative.   Physical Examination: There were no vitals filed for this visit. There were no vitals filed for this visit. There is no height or weight on file to calculate BMI. Ideal Body Weight:    GEN: WDWN, NAD, Non-toxic, A & O x 3 HEENT: Atraumatic, Normocephalic. Neck supple. No masses, No LAD. Ears and Nose: No external deformity. CV: RRR, No M/G/R. No  JVD. No thrill. No extra heart sounds. PULM: CTA B, no wheezes, crackles, rhonchi. No retractions. No resp. distress. No accessory muscle use. ABD: S, NT, ND, +BS. No rebound. No HSM. EXTR: No c/c/e NEURO Normal gait.  PSYCH: Normally interactive. Conversant. Not depressed or anxious appearing.  Calm demeanor.    Assessment and Plan: ***  Signed Lamar Blinks, MD

## 2019-01-08 ENCOUNTER — Ambulatory Visit: Payer: Medicare Other | Admitting: Family Medicine

## 2019-01-08 NOTE — Progress Notes (Addendum)
[place] at [facility] [address] [place],  [address] [phone]  Date:  01/10/2019   Name:  Jennifer Gonzalez   DOB:  1945-06-29   MRN:  [id number]  PCP:  Darreld Mclean, MD    Chief Complaint: Hypothyroidism (would like labs, hair loss, fatigue, feeling cold) and Lab Work (kidney function test)   History of Present Illness:  Jennifer Gonzalez is a 73 y.o. very pleasant female patient who presents with the following:  Patient with history of hypertension, hyperlipidemia.  Here today with concern of hair loss and feeling cold Over this past summer she noticed that she would feel cold even when the weather was quite hot She will get hoarse with talking She has noted hair loss for a long time- she uses rogaine and latisse- however over the last 3 months her results are not as good   Last seen by myself about 1 year ago  Colon cancer screening-patient reports she did Cologuard summer 2019 Flu shot- pt declines today Bone density done in 2018, can order repeat with her next mammogram She declines a dexa for now  Most recent labs December 2019- will do today  Suggest Shingrix- we discussed today   She stopped taking her statin due to bad leg cramps - she was on pravachol but had to stop it due to SE  BP Readings from Last 3 Encounters:  01/10/19 (!) 152/80  03/08/18 136/80  02/21/17 (!) 142/84   Pulse Readings from Last 3 Encounters:  01/10/19 96  03/08/18 90  02/21/17 91      Patient Active Problem List   Diagnosis Date Noted  . Essential hypertension 02/21/2017  . Mixed hyperlipidemia 02/21/2017  . 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 Arthritis   . Chicken pox   . High cholesterol   . Hypertension   . Seasonal allergies     Past Surgical History:  Procedure Laterality Date  . APPENDECTOMY  1969  . AUGMENTATION MAMMAPLASTY    .  BREAST ENHANCEMENT SURGERY  1980  . CESAREAN SECTION  1969    Social History   Tobacco Use  . Smoking status: Never Smoker  . Smokeless tobacco: Never Used  Substance Use Topics  . Alcohol use: Yes  . Drug use: No    Family History  Problem Relation Age of Onset  . Cancer Mother   . Early death Mother   . Hypertension Mother   . Alcohol abuse Father   . Arthritis Father   . Asthma Father   . COPD Father   . Heart disease Father   . Hypertension Father   . Asthma Daughter     Allergies  Allergen Reactions  . Amlodipine     Dizziness   . Aspirin Tinitus  . Citrullus Vulgaris Itching  . Clindamycin Other (See Comments)    Esophageal inflammation    . Codeine Other (See Comments)    Hallucinations    . Diphenhydramine Hcl (Sleep) Other (See Comments)    unknown unknown   . Loratadine Other (See Comments)    unknown   . Metronidazole Itching  . Penicillins Itching  . Pineapple Itching  . Tramadol Other (See Comments)    woozie feeling     Medication list has been reviewed and updated.  Current Outpatient Medications on File Prior to Visit  Medication Sig Dispense Refill  .  Ascorbic Acid (VITAMIN C) 1000 MG tablet Take 1,000 mg by mouth.    . chlorpheniramine (CHLOR-TRIMETON) 4 MG tablet Take 1 tablet by mouth 2 (two) times daily.    . Cholecalciferol (VITAMIN D3) 2000 units TABS Take 1 tablet by mouth daily.    Marland Kitchen. ibuprofen (ADVIL,MOTRIN) 200 MG tablet Take 200 mg by mouth 4 (four) times daily. 2 in morning, 2 in evening    . Multiple Vitamin (MULTIVITAMIN) capsule Take 1 capsule by mouth daily.    Marland Kitchen. OVER THE COUNTER MEDICATION Take 1,000 mg by mouth 2 (two) times daily. TURMERIC     . OVER THE COUNTER MEDICATION Take 750 mg by mouth daily. Mega Red    . Probiotic CAPS Take 1 capsule by mouth daily.     No current facility-administered medications on file prior to visit.     Review of Systems:  As per HPI- otherwise negative. She does not generally  check her home BP- she has been under a lot of stress   Physical Examination: Vitals:   01/10/19 1258 01/10/19 1315  BP: (!) 152/80   Pulse: (!) 108 96  Resp: 16   Temp: (!) 97.3 F (36.3 C)   SpO2: 98%    Vitals:   01/10/19 1258  Weight: 161 lb (73 kg)  Height: 5\' 5"  (1.651 m)   Body mass index is 26.79 kg/m. Ideal Body Weight: Weight in (lb) to have BMI = 25: 149.9  GEN: WDWN, NAD, Non-toxic, A & O x 3, mild overweight, looks well  HEENT: Atraumatic, Normocephalic. Neck supple. No masses, No LAD. Ears and Nose: No external deformity. CV: RRR, No M/G/R. No JVD. No thrill. No extra heart sounds. PULM: CTA B, no wheezes, crackles, rhonchi. No retractions. No resp. distress. No accessory muscle use. ABD: S, NT, ND, +BS. No rebound. No HSM. EXTR: No c/c/e NEURO Normal gait.  PSYCH: Normally interactive. Conversant. Not depressed or anxious appearing.  Calm demeanor.   Assessment and Plan: Mixed hyperlipidemia - Plan: Lipid panel  Essential hypertension - Plan: CBC, Comprehensive metabolic panel, losartan-hydrochlorothiazide (HYZAAR) 100-25 MG tablet  Elevated glucose - Plan: Hemoglobin A1c  Hair loss - Plan: TSH, T3, free  Labs pending as above- she stopped her statin due to SE Will check thyroid levels Declines flu shot Increase BP med to losartan 100/hctz 25 due to elevated BP Will plan further follow- up pending labs. Suggest shingels   Signed Abbe AmsterdamJessica Cayleigh Paull, MD  Received her labs 10/22, message to patient  Results for orders placed or performed in visit on 01/10/19  CBC  Result Value Ref Range   WBC 5.4 4.0 - 10.5 K/uL   RBC 3.77 (L) 3.87 - 5.11 Mil/uL   Platelets 254.0 150.0 - 400.0 K/uL   Hemoglobin 12.4 12.0 - 15.0 g/dL   HCT 40.936.6 81.136.0 - 91.446.0 %   MCV 97.2 78.0 - 100.0 fl   MCHC 33.9 30.0 - 36.0 g/dL   RDW 78.212.4 95.611.5 - 21.315.5 %  Comprehensive metabolic panel  Result Value Ref Range   Sodium 140 135 - 145 mEq/L   Potassium 4.5 3.5 - 5.1 mEq/L    Chloride 101 96 - 112 mEq/L   CO2 30 19 - 32 mEq/L   Glucose, Bld 114 (H) 70 - 99 mg/dL   BUN 29 (H) 6 - 23 mg/dL   Creatinine, Ser 0.860.91 0.40 - 1.20 mg/dL   Total Bilirubin 0.4 0.2 - 1.2 mg/dL   Alkaline Phosphatase 58 39 - 117 U/L  AST 27 0 - 37 U/L   ALT 33 0 - 35 U/L   Total Protein 6.6 6.0 - 8.3 g/dL   Albumin 4.6 3.5 - 5.2 g/dL   Calcium 67.6 8.4 - 19.5 mg/dL   GFR 09.32 >67.12 mL/min  Hemoglobin A1c  Result Value Ref Range   Hgb A1c MFr Bld 5.3 4.6 - 6.5 %  Lipid panel  Result Value Ref Range   Cholesterol 266 (H) 0 - 200 mg/dL   Triglycerides 458.0 (H) 0.0 - 149.0 mg/dL   HDL 99.83 >38.25 mg/dL   VLDL 05.3 (H) 0.0 - 97.6 mg/dL   Total CHOL/HDL Ratio 3    NonHDL 185.28   TSH  Result Value Ref Range   TSH 1.91 0.35 - 4.50 uIU/mL  T3, free  Result Value Ref Range   T3, Free 3.0 2.3 - 4.2 pg/mL  LDL cholesterol, direct  Result Value Ref Range   Direct LDL 135.0 mg/dL   Your blood counts look fine Metabolic profile is okay, except your BUN level is high.  This may indicate that your kidneys are not getting enough blood flow.  I am going to set up an ultrasound to check the renal arteries and make sure there is no impediment to your kidney circulation.  We will arrange this test and get in touch with you  Your A1c-average blood sugar the previous 3 months-is fine  Your cholesterol is worse off of your statin medication.  Triglycerides are an issue for you-we might try a prescription omega-3 if you like.  This may improve your numbers without use of a statin.  Please let me know if you would like to try this medication  Your thyroid numbers look just fine, I am afraid no explanation for your hair loss. You may have temporary stress-related hair loss, which may improve in a few months.  However, if you would like to consult with dermatology I am glad to set this up-just let me now  Please plan to see me in about 6 months, take care

## 2019-01-08 NOTE — Patient Instructions (Addendum)
It was nice to see you again today, You might get the shingles vaccine at your drugstore, if you like-this is a 2 shot series which helps prevent shingles I will be in touch with your labs asap  We will look for any cause of your hair loss and cold intolerance We will increase your BP med to losartan 100/hctx 25- still one tablet daily  Take care

## 2019-01-10 ENCOUNTER — Ambulatory Visit (INDEPENDENT_AMBULATORY_CARE_PROVIDER_SITE_OTHER): Payer: Medicare Other | Admitting: Family Medicine

## 2019-01-10 ENCOUNTER — Other Ambulatory Visit: Payer: Self-pay

## 2019-01-10 ENCOUNTER — Encounter: Payer: Self-pay | Admitting: Family Medicine

## 2019-01-10 VITALS — BP 152/80 | HR 96 | Temp 97.3°F | Resp 16 | Ht 65.0 in | Wt 161.0 lb

## 2019-01-10 DIAGNOSIS — R7309 Other abnormal glucose: Secondary | ICD-10-CM

## 2019-01-10 DIAGNOSIS — L659 Nonscarring hair loss, unspecified: Secondary | ICD-10-CM | POA: Diagnosis not present

## 2019-01-10 DIAGNOSIS — R799 Abnormal finding of blood chemistry, unspecified: Secondary | ICD-10-CM

## 2019-01-10 DIAGNOSIS — E782 Mixed hyperlipidemia: Secondary | ICD-10-CM | POA: Diagnosis not present

## 2019-01-10 DIAGNOSIS — I10 Essential (primary) hypertension: Secondary | ICD-10-CM

## 2019-01-10 LAB — LIPID PANEL
Cholesterol: 266 mg/dL — ABNORMAL HIGH (ref 0–200)
HDL: 80.7 mg/dL (ref >39.00)
NonHDL: 185.28
Total CHOL/HDL Ratio: 3
Triglycerides: 305 mg/dL — ABNORMAL HIGH (ref 0.0–149.0)
VLDL: 61 mg/dL — ABNORMAL HIGH (ref 0.0–40.0)

## 2019-01-10 LAB — COMPREHENSIVE METABOLIC PANEL
ALT: 33 U/L (ref 0–35)
AST: 27 U/L (ref 0–37)
Albumin: 4.6 g/dL (ref 3.5–5.2)
Alkaline Phosphatase: 58 U/L (ref 39–117)
BUN: 29 mg/dL — ABNORMAL HIGH (ref 6–23)
CO2: 30 mEq/L (ref 19–32)
Calcium: 10 mg/dL (ref 8.4–10.5)
Chloride: 101 mEq/L (ref 96–112)
Creatinine, Ser: 0.91 mg/dL (ref 0.40–1.20)
GFR: 60.42 mL/min (ref >60.00)
Glucose, Bld: 114 mg/dL — ABNORMAL HIGH (ref 70–99)
Potassium: 4.5 mEq/L (ref 3.5–5.1)
Sodium: 140 mEq/L (ref 135–145)
Total Bilirubin: 0.4 mg/dL (ref 0.2–1.2)
Total Protein: 6.6 g/dL (ref 6.0–8.3)

## 2019-01-10 LAB — CBC
HCT: 36.6 % (ref 36.0–46.0)
Hemoglobin: 12.4 g/dL (ref 12.0–15.0)
MCHC: 33.9 g/dL (ref 30.0–36.0)
MCV: 97.2 fl (ref 78.0–100.0)
Platelets: 254 10*3/uL (ref 150.0–400.0)
RBC: 3.77 Mil/uL — ABNORMAL LOW (ref 3.87–5.11)
RDW: 12.4 % (ref 11.5–15.5)
WBC: 5.4 10*3/uL (ref 4.0–10.5)

## 2019-01-10 LAB — TSH: TSH: 1.91 u[IU]/mL (ref 0.35–4.50)

## 2019-01-10 LAB — LDL CHOLESTEROL, DIRECT: Direct LDL: 135 mg/dL

## 2019-01-10 LAB — T3, FREE: T3, Free: 3 pg/mL (ref 2.3–4.2)

## 2019-01-10 LAB — HEMOGLOBIN A1C: Hgb A1c MFr Bld: 5.3 % (ref 4.6–6.5)

## 2019-01-10 MED ORDER — LOSARTAN POTASSIUM-HCTZ 100-25 MG PO TABS: 1.0000 | ORAL_TABLET | Freq: Every day | ORAL | 3 refills | Status: DC

## 2019-01-11 ENCOUNTER — Encounter: Payer: Self-pay | Admitting: Family Medicine

## 2019-01-11 DIAGNOSIS — E782 Mixed hyperlipidemia: Secondary | ICD-10-CM

## 2019-01-11 MED ORDER — OMEGA-3-ACID ETHYL ESTERS 1 G PO CAPS: 2.0000 g | ORAL_CAPSULE | Freq: Two times a day (BID) | ORAL | 11 refills | Status: DC

## 2019-01-11 NOTE — Addendum Note (Signed)
Addended by: Lamar Blinks C on: 01/11/2019 06:02 AM   Modules accepted: Orders

## 2019-01-16 ENCOUNTER — Telehealth: Payer: Self-pay | Admitting: Family Medicine

## 2019-01-16 DIAGNOSIS — I10 Essential (primary) hypertension: Secondary | ICD-10-CM

## 2019-01-16 NOTE — Telephone Encounter (Signed)
-----   Message from Katha Hamming sent at 01/15/2019  1:32 PM EDT ----- Regarding: US renal artery duplex We do not do these ultrasounds.  It will need to be sent to the vascular lab.  The order will need to be changed.  Thank you,  Hoyle Sauer

## 2019-01-17 ENCOUNTER — Telehealth (HOSPITAL_COMMUNITY): Payer: Self-pay | Admitting: *Deleted

## 2019-01-17 NOTE — Telephone Encounter (Signed)
Left VM asking pt to return my call

## 2019-01-23 ENCOUNTER — Telehealth: Payer: Self-pay | Admitting: *Deleted

## 2019-01-23 NOTE — Telephone Encounter (Signed)
Copied from [employer] [id number]. Topic: General - Inquiry >> Jan 23, 2019  8:11 AM Richardo Priest, NT wrote: Reason for CRM: Jennifer Gonzalez called in stating pt has told them that she does not want to go through with the imaging and she has canceled the appointment. Jennifer Gonzalez called to inform office she will be canceling order. Please advise.

## 2019-01-23 NOTE — Telephone Encounter (Signed)
ok 

## 2019-01-24 ENCOUNTER — Inpatient Hospital Stay (HOSPITAL_COMMUNITY): Admission: RE | Admit: 2019-01-24 | Payer: Medicare Other | Source: Ambulatory Visit

## 2019-04-09 ENCOUNTER — Encounter: Payer: Self-pay | Admitting: Family Medicine

## 2019-04-13 ENCOUNTER — Other Ambulatory Visit: Payer: Self-pay

## 2019-04-14 NOTE — Progress Notes (Addendum)
[facility] at [facility] [place], [place], [place] [address] [phone]  Date:  04/16/2019   Name:  Jennifer Gonzalez   DOB:  11-Oct-1945   MRN:  [id number]  PCP:  Darreld Mclean, MD    Chief Complaint: Arthritis (joint pain)   History of Present Illness:  Jennifer Gonzalez is a 74 y.o. very pleasant female patient who presents with the following:  History of hypertension, dyslipidemia Here today to discuss arthritis pain  Last seen by myself in October with concern of hair loss and feeling cold She has been unable to tolerate statins due to leg cramps At her last visit I increased her blood pressure medication dosage  Colon cancer screening- she declines for now, discussed and offered Cologuard.  However she does not think she would be willing to have a colonoscopy in case of positive Cologuard Mammogram is up-to-date Complete labs done in October  She is using a new shampoo and seems to growing new hair- she is pleased with this   Her youngest daughter is concerned that she might have an autoimmune disorder-1 daughter has Hashimoto's thyroiditis, the other has psoriasis.  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  She has used her hands quite a bit  BP Readings from Last 3 Encounters:  04/16/19 (!) 142/80  01/10/19 (!) 152/80  03/08/18 136/80      Patient Active Problem List   Diagnosis Date Noted  . Essential hypertension 02/21/2017  . Mixed hyperlipidemia 02/21/2017  . 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 Arthritis   . Chicken pox   . High cholesterol   . Hypertension   . Seasonal allergies     Past Surgical History:  Procedure Laterality Date  . APPENDECTOMY  1969  . AUGMENTATION MAMMAPLASTY    . BREAST  ENHANCEMENT SURGERY  1980  . CESAREAN SECTION  1969    Social History   Tobacco Use  . Smoking status: Never Smoker  . Smokeless tobacco: Never Used  Substance Use Topics  . Alcohol use: Yes  . Drug use: No    Family History  Problem Relation Age of Onset  . Cancer Mother   . Early death Mother   . Hypertension Mother   . Alcohol abuse Father   . Arthritis Father   . Asthma Father   . COPD Father   . Heart disease Father   . Hypertension Father   . Asthma Daughter     Allergies  Allergen Reactions  . Amlodipine     Dizziness   . Aspirin Tinitus  . Citrullus Vulgaris Itching  . Clindamycin Other (See Comments)    Esophageal inflammation    . Codeine Other (See Comments)    Hallucinations    . Diphenhydramine Hcl (Sleep) Other (See Comments)    unknown unknown   . Loratadine Other (See Comments)    unknown   . Metronidazole Itching  . Penicillins Itching  . Pineapple Itching  . Tramadol Other (See Comments)    woozie feeling     Medication list has been reviewed and updated.  Current Outpatient Medications on File Prior to Visit  Medication Sig Dispense Refill  . Ascorbic Acid (VITAMIN C) 1000 MG tablet Take 1,000  mg by mouth.    . chlorpheniramine (CHLOR-TRIMETON) 4 MG tablet Take 1 tablet by mouth 2 (two) times daily.    . Cholecalciferol (VITAMIN D3) 2000 units TABS Take 1 tablet by mouth daily.    Marland Kitchen ibuprofen (ADVIL,MOTRIN) 200 MG tablet Take 200 mg by mouth 4 (four) times daily. 2 in morning, 2 in evening    . losartan-hydrochlorothiazide (HYZAAR) 100-25 MG tablet Take 1 tablet by mouth daily. 90 tablet 3  . Multiple Vitamin (MULTIVITAMIN) capsule Take 1 capsule by mouth daily.    Marland Kitchen OVER THE COUNTER MEDICATION Take 1,000 mg by mouth 2 (two) times daily. TURMERIC     . OVER THE COUNTER MEDICATION Take 750 mg by mouth daily. Mega Red    . Probiotic CAPS Take 1 capsule by mouth daily.     No current facility-administered medications on file prior to  visit.    Review of Systems:  As per HPI- otherwise negative.   Physical Examination: Vitals:   04/16/19 1316 04/16/19 1434  BP: (!) 142/80 (!) 142/80  Pulse: (!) 107 96  Resp: 16   Temp: (!) 97.1 F (36.2 C)   SpO2: 97%    Vitals:   04/16/19 1316  Weight: 162 lb (73.5 kg)  Height: 5\' 5"  (1.651 m)   Body mass index is 26.96 kg/m. Ideal Body Weight: Weight in (lb) to have BMI = 25: 149.9  GEN: WDWN, NAD, Non-toxic, A & O x 3, overweight, looks well HEENT: Atraumatic, Normocephalic. Neck supple. No masses, No LAD. Ears and Nose: No external deformity. CV: RRR, No M/G/R. No JVD. No thrill. No extra heart sounds. PULM: CTA B, no wheezes, crackles, rhonchi. No retractions. No resp. distress. No accessory muscle use. ABD: S, NT, ND, +BS. No rebound. No HSM. EXTR: No c/c/e NEURO Normal gait.  PSYCH: Normally interactive. Conversant. Not depressed or anxious appearing.  Calm demeanor.  Mild crepitus of knees bilaterally She has mild changes of osteoarthritis present in both hands, especially at the left first MCP   Assessment and Plan: Family history of Hashimoto thyroiditis - Plan: Thyroid peroxidase antibody  Hair thinning - Plan: Thyroid peroxidase antibody  Arthralgia, unspecified joint - Plan: Sedimentation rate, C-reactive protein, Cyclic citrul peptide antibody, IgG (QUEST), Rheumatoid Factor, ANA  Left hand pain - Plan: DG Hand Complete Left  Encounter for screening mammogram for breast cancer - Plan: MM 3D SCREEN BREAST W/IMPLANT BILATERAL, CANCELED: MM 3D SCREEN BREAST BILATERAL  Here today with a couple of concerns Ordered screening mammogram Family history of autoimmune disorder including Hashimoto's thyroiditis.  We will check a thyroid peroxidase antibody, also autoimmune screening labs as above Evaluate for possible RA today, though she most likely has osteoarthritis Moderate medical decision making today This visit occurred during the SARS-CoV-2 public  health emergency.  Safety protocols were in place, including screening questions prior to the visit, additional usage of staff PPE, and extensive cleaning of exam room while observing appropriate contact time as indicated for disinfecting solutions.    Signed , MD  Received her results as follows-some still pending Message to patient  Results for orders placed or performed in visit on 04/16/19  Sedimentation rate  Result Value Ref Range   Sed Rate 6 0 - 30 mm/hr  C-reactive protein  Result Value Ref Range   CRP <1.0 0.5 - 20.0 mg/dL  Cyclic citrul peptide antibody, IgG (QUEST)  Result Value Ref Range   Cyclic Citrullin Peptide Ab 04/18/19 UNITS  Rheumatoid Factor  Result Value Ref Range   Rhuematoid fact SerPl-aCnc <14 <14 IU/mL  ANA  Result Value Ref Range   Anti Nuclear Antibody (ANA) NEGATIVE NEGATIVE  Thyroid peroxidase antibody  Result Value Ref Range   Thyroperoxidase Ab SerPl-aCnc <1 <9 IU/mL    DG Hand Complete Left  Result Date: 04/16/2019 CLINICAL DATA:  Chronic LEFT hand pain for several years. No known injury. Initial encounter. EXAM: LEFT HAND - COMPLETE 3+ VIEW COMPARISON:  None. FINDINGS: No acute fracture or dislocation identified. Moderate degenerative changes of 1st carpometacarpal joint identified. Mild degenerative changes in the PIP joints identified. Wrist chondrocalcinosis is present. No erosive changes are noted. IMPRESSION: 1. No evidence of acute abnormality. 2. Degenerative changes as described. 3. Wrist chondrocalcinosis/CPPD Electronically Signed   By: Harmon Pier M.D.   On: 04/16/2019 16:50   MM 3D SCREEN BREAST W/IMPLANT BILATERAL  Result Date: 04/17/2019 CLINICAL DATA:  Screening. EXAM: DIGITAL SCREENING BILATERAL MAMMOGRAM WITH IMPLANTS, CAD AND TOMO The patient has implants. Standard and implant displaced views were performed. COMPARISON:  Previous exam(s). ACR Breast Density Category b: There are scattered areas of fibroglandular  density. FINDINGS: There are no findings suspicious for malignancy. Images were processed with CAD. IMPRESSION: No mammographic evidence of malignancy. A result letter of this screening mammogram will be mailed directly to the patient. RECOMMENDATION: Screening mammogram in one year. (Code:SM-B-01Y) BI-RADS CATEGORY  1:  Negative. Electronically Signed   By: Sherian Rein M.D.   On: 04/17/2019 13:03    Received further results 1/26- message to pt  More labs 1/28

## 2019-04-16 ENCOUNTER — Ambulatory Visit (INDEPENDENT_AMBULATORY_CARE_PROVIDER_SITE_OTHER): Payer: Medicare Other | Admitting: Family Medicine

## 2019-04-16 ENCOUNTER — Encounter (HOSPITAL_BASED_OUTPATIENT_CLINIC_OR_DEPARTMENT_OTHER): Payer: Self-pay

## 2019-04-16 ENCOUNTER — Ambulatory Visit (HOSPITAL_BASED_OUTPATIENT_CLINIC_OR_DEPARTMENT_OTHER)
Admission: RE | Admit: 2019-04-16 | Discharge: 2019-04-16 | Disposition: A | Discharge status: Home / Self Care | Payer: Medicare Other | Source: Ambulatory Visit | Attending: Family Medicine | Admitting: Family Medicine

## 2019-04-16 ENCOUNTER — Other Ambulatory Visit: Payer: Self-pay

## 2019-04-16 ENCOUNTER — Encounter: Payer: Self-pay | Admitting: Family Medicine

## 2019-04-16 VITALS — BP 142/80 | HR 96 | Temp 97.1°F | Resp 16 | Ht 65.0 in | Wt 162.0 lb

## 2019-04-16 DIAGNOSIS — M79642 Pain in left hand: Secondary | ICD-10-CM | POA: Diagnosis not present

## 2019-04-16 DIAGNOSIS — Z1231 Encounter for screening mammogram for malignant neoplasm of breast: Secondary | ICD-10-CM

## 2019-04-16 DIAGNOSIS — Z8349 Family history of other endocrine, nutritional and metabolic diseases: Secondary | ICD-10-CM

## 2019-04-16 DIAGNOSIS — M2550 Pain in unspecified joint: Secondary | ICD-10-CM | POA: Diagnosis not present

## 2019-04-16 DIAGNOSIS — L659 Nonscarring hair loss, unspecified: Secondary | ICD-10-CM | POA: Diagnosis not present

## 2019-04-16 LAB — C-REACTIVE PROTEIN: CRP: <1 mg/dL (ref 0.5–20.0)

## 2019-04-16 LAB — SEDIMENTATION RATE: Sed Rate: 6 mm/hr (ref 0–30)

## 2019-04-16 NOTE — Patient Instructions (Addendum)
It was good to see you today- I will be in touch with your labs and x-rays asap  We will look for any sign RA or other auto-immune disorder

## 2019-04-17 ENCOUNTER — Encounter: Payer: Self-pay | Admitting: Family Medicine

## 2019-04-19 LAB — CYCLIC CITRUL PEPTIDE ANTIBODY, IGG: Cyclic Citrullin Peptide Ab: <16 UNITS

## 2019-04-19 LAB — RHEUMATOID FACTOR: Rheumatoid fact SerPl-aCnc: <14 IU/mL (ref <14)

## 2019-04-19 LAB — ANA: Anti Nuclear Antibody (ANA): NEGATIVE

## 2019-04-19 LAB — THYROID PEROXIDASE ANTIBODY: Thyroperoxidase Ab SerPl-aCnc: <1 IU/mL (ref <9)

## 2019-04-23 ENCOUNTER — Other Ambulatory Visit: Payer: Self-pay

## 2019-04-23 ENCOUNTER — Emergency Department (HOSPITAL_BASED_OUTPATIENT_CLINIC_OR_DEPARTMENT_OTHER)
Admission: EM | Admit: 2019-04-23 | Discharge: 2019-04-23 | Disposition: A | Discharge status: Home / Self Care | Payer: Medicare Other | Attending: Emergency Medicine | Admitting: Emergency Medicine

## 2019-04-23 ENCOUNTER — Encounter (HOSPITAL_BASED_OUTPATIENT_CLINIC_OR_DEPARTMENT_OTHER): Payer: Self-pay

## 2019-04-23 ENCOUNTER — Emergency Department (HOSPITAL_BASED_OUTPATIENT_CLINIC_OR_DEPARTMENT_OTHER): Payer: Medicare Other

## 2019-04-23 DIAGNOSIS — R000 Tachycardia, unspecified: Secondary | ICD-10-CM | POA: Diagnosis not present

## 2019-04-23 DIAGNOSIS — I10 Essential (primary) hypertension: Secondary | ICD-10-CM | POA: Insufficient documentation

## 2019-04-23 DIAGNOSIS — Z79899 Other long term (current) drug therapy: Secondary | ICD-10-CM | POA: Diagnosis not present

## 2019-04-23 DIAGNOSIS — R0789 Other chest pain: Secondary | ICD-10-CM | POA: Insufficient documentation

## 2019-04-23 DIAGNOSIS — R079 Chest pain, unspecified: Secondary | ICD-10-CM | POA: Diagnosis not present

## 2019-04-23 LAB — BASIC METABOLIC PANEL
Anion gap: 7 (ref 5–15)
BUN: 31 mg/dL — ABNORMAL HIGH (ref 8–23)
CO2: 29 mmol/L (ref 22–32)
Calcium: 10.8 mg/dL — ABNORMAL HIGH (ref 8.9–10.3)
Chloride: 101 mmol/L (ref 98–111)
Creatinine, Ser: 0.93 mg/dL (ref 0.44–1.00)
GFR calc Af Amer: >60 mL/min (ref >60)
GFR calc non Af Amer: >60 mL/min (ref >60)
Glucose, Bld: 111 mg/dL — ABNORMAL HIGH (ref 70–99)
Potassium: 4.3 mmol/L (ref 3.5–5.1)
Sodium: 137 mmol/L (ref 135–145)

## 2019-04-23 LAB — CBC
HCT: 39 % (ref 36.0–46.0)
Hemoglobin: 12.9 g/dL (ref 12.0–15.0)
MCH: 32.4 pg (ref 26.0–34.0)
MCHC: 33.1 g/dL (ref 30.0–36.0)
MCV: 98 fL (ref 80.0–100.0)
Platelets: 226 10*3/uL (ref 150–400)
RBC: 3.98 MIL/uL (ref 3.87–5.11)
RDW: 11.8 % (ref 11.5–15.5)
WBC: 5.6 10*3/uL (ref 4.0–10.5)
nRBC: 0 % (ref 0.0–0.2)

## 2019-04-23 LAB — TROPONIN I (HIGH SENSITIVITY)
Troponin I (High Sensitivity): 3 ng/L (ref <18)
Troponin I (High Sensitivity): 4 ng/L (ref <18)

## 2019-04-23 MED ORDER — ALUM & MAG HYDROXIDE-SIMETH 200-200-20 MG/5ML PO SUSP
30.0000 mL | Freq: Once | ORAL | Status: AC
  Administered 2019-04-23: 30 mL via ORAL
  Filled 2019-04-23: qty 30

## 2019-04-23 MED ORDER — SODIUM CHLORIDE 0.9% FLUSH
3.0000 mL | Freq: Once | INTRAVENOUS | Status: AC
  Administered 2019-04-23: 3 mL via INTRAVENOUS
  Filled 2019-04-23: qty 3

## 2019-04-23 NOTE — ED Triage Notes (Signed)
C/o CP x this am-NAD-steady gait

## 2019-04-23 NOTE — ED Notes (Signed)
Pt ambulated to bathroom without difficulty.

## 2019-04-23 NOTE — ED Provider Notes (Signed)
[facility] [facility] EMERGENCY DEPARTMENT Provider Note   CSN: [id number] Arrival date & time: 04/23/19  1359     History Chief Complaint  Patient presents with  . Chest Pain    Jennifer Gonzalez is a 74 y.o. female with past medical history of hypertension, hyperlipidemia, presenting to the emergency department with complaint of chest pain that began this morning.  She states she was cooking breakfast when her symptoms began.  She describes a central chest tightness that felt similar to prior episode of esophagitis.  She reports associated decreased appetite though no nausea, diaphoresis, radiation of pain, shortness of breath.  No fevers or cough.  No leg swelling.  She treated her symptoms with Tums throughout the morning which provided some improvement of symptoms though patient's symptoms have been constant since onset.  No known personal cardiac history, no history of cardiac stress test.  She takes losartan-HCTZ for blood pressure, and took her medications this morning.  The history is provided by the patient.       Past Medical History:  Diagnosis Date  . Arthritis   . Chicken pox   . High cholesterol   . Hypertension   . Seasonal allergies     Patient Active Problem List   Diagnosis Date Noted  . Essential hypertension 02/21/2017  . Mixed hyperlipidemia 02/21/2017  . 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 APPENDECTOMY  1969  . AUGMENTATION MAMMAPLASTY    . BREAST ENHANCEMENT SURGERY  1980  . CESAREAN SECTION  1969     OB History   No obstetric history on file.     Family History  Problem Relation Age of Onset  . Cancer Mother   . Early death Mother   . Hypertension Mother   . Alcohol abuse Father   . Arthritis Father   . Asthma Father   . COPD Father   . Heart disease Father   . Hypertension Father   . Asthma Daughter     Social History   Tobacco Use  . Smoking status:  Never Smoker  . Smokeless tobacco: Never Used  Substance Use Topics  . Alcohol use: Yes    Comment: daily  . Drug use: No    Home Medications Prior to Admission medications   Medication Sig Start Date End Date Taking? Authorizing Provider  Ascorbic Acid (VITAMIN C) 1000 MG tablet Take 1,000 mg by mouth.    [provider]  chlorpheniramine (CHLOR-TRIMETON) 4 MG tablet Take 1 tablet by mouth 2 (two) times daily.    [provider]  Cholecalciferol (VITAMIN D3) 2000 units TABS Take 1 tablet by mouth daily.    [provider]  ibuprofen (ADVIL,MOTRIN) 200 MG tablet Take 200 mg by mouth 4 (four) times daily. 2 in morning, 2 in evening    [provider]  losartan-hydrochlorothiazide (HYZAAR) 100-25 MG tablet Take 1 tablet by mouth daily. 01/10/19   Copland, Gwenlyn Found, MD  Multiple Vitamin (MULTIVITAMIN) capsule Take 1 capsule by mouth daily.    [provider]  OVER THE COUNTER MEDICATION Take 1,000 mg by mouth 2 (two) times daily. TURMERIC     [provider]  OVER THE COUNTER MEDICATION Take 750 mg by mouth daily. Mega Red    [provider]  Probiotic CAPS Take 1 capsule by mouth daily.    [provider]    Allergies    Amlodipine, Aspirin, Citrullus vulgaris, Clindamycin,  Codeine, Diphenhydramine hcl (sleep), Loratadine, Metronidazole, Penicillins, Pineapple, and Tramadol  Review of Systems   Review of Systems  All other systems reviewed and are negative.   Physical Exam Updated Vital Signs BP (!) 188/94   Pulse (!) 117   Temp 98.6 F (37 C) (Oral)   Resp (!) 21   Ht 5\' 5"  (1.651 m)   Wt 73.5 kg   SpO2 100%   BMI 26.96 kg/m   Physical Exam Vitals and nursing note reviewed.  Constitutional:      General: She is not in acute distress.    Appearance: She is well-developed.  HENT:     Head: Normocephalic and atraumatic.  Eyes:     Conjunctiva/sclera: Conjunctivae normal.  Cardiovascular:     Rate  and Rhythm: Normal rate and regular rhythm.  Pulmonary:     Effort: Pulmonary effort is normal. No respiratory distress.     Breath sounds: Normal breath sounds.  Chest:     Chest wall: No tenderness.  Abdominal:     General: Bowel sounds are normal.     Palpations: Abdomen is soft.     Tenderness: There is no abdominal tenderness. There is no guarding or rebound.  Musculoskeletal:     Right lower leg: No edema.     Left lower leg: No edema.  Skin:    General: Skin is warm.  Neurological:     Mental Status: She is alert.  Psychiatric:        Behavior: Behavior normal.     ED Results / Procedures / Treatments   Labs (all labs ordered are listed, but only abnormal results are displayed) Labs Reviewed  BASIC METABOLIC PANEL - Abnormal; Notable for the following components:      Result Value   Glucose, Bld 111 (*)    BUN 31 (*)    Calcium 10.8 (*)    All other components within normal limits  CBC  TROPONIN I (HIGH SENSITIVITY)  TROPONIN I (HIGH SENSITIVITY)    EKG EKG Interpretation  Date/Time:  Monday April 23 2019 14:05:11 EST Ventricular Rate:  106 PR Interval:  164 QRS Duration: 78 QT Interval:  352 QTC Calculation: 467 R Axis:   28 Text Interpretation: Sinus tachycardia Possible Left atrial enlargement Low voltage QRS Cannot rule out Anterior infarct , age undetermined Abnormal ECG No old tracing to compare Confirmed by Sherwood Gambler [phone]) on 04/23/2019 2:48:24 PM   Radiology DG Chest 2 View  Result Date: 04/23/2019 CLINICAL DATA:  Chest pain EXAM: CHEST - 2 VIEW COMPARISON:  None. FINDINGS: The heart size and mediastinal contours are within normal limits. Both lungs are clear. The visualized skeletal structures are unremarkable. Bilateral breast implants. IMPRESSION: No active cardiopulmonary disease. Electronically Signed   By: Kathreen Devoid   On: 04/23/2019 14:19    Procedures Procedures (including critical care time)  Medications Ordered in  ED Medications  sodium chloride flush (NS) 0.9 % injection 3 mL (3 mLs Intravenous Given 04/23/19 1502)  alum & mag hydroxide-simeth (MAALOX/MYLANTA) 200-200-20 MG/5ML suspension 30 mL (30 mLs Oral Given 04/23/19 1707)    ED Course  I have reviewed the triage vital signs and the nursing notes.  Pertinent labs & imaging results that were available during my care of the patient were reviewed by me and considered in my medical decision making (see chart for details).    MDM Rules/Calculators/A&P  Patient presenting with chest pain that began this morning while making breakfast, constant in nature and improving upon arrival to the ED this afternoon. Improved some at home with tums. She states sx feel similar to prior episode of "inflammation in esophagus." No nausea, diaphoresis, radiation of pain or shortness of breath.  On exam she is well-appearing and in no distress.  She is noted to be hypertensive and took her medications this morning.  Vital signs are otherwise stable in the 90s through review of continuous cardiac monitor.  Trop x 2 are neg. CXR neg. EKG nonischemic. BMP and CBC are reassuring. Discussed with and evaluated by Dr. Judd Lien. Recommends GI cocktail, and treat symptomatically. Symptoms are atypical for ACS. Pt will be discharged with instructions to follow closely with PCP, recheck BP, and return for new or worsening symptoms.  Discussed results, findings, treatment and follow up. Patient advised of return precautions. Patient verbalized understanding and agreed with plan.  Final Clinical Impression(s) / ED Diagnoses Final diagnoses:  Atypical chest pain    Rx / DC Orders ED Discharge Orders    None       Assad Harbeson, [place] N, PA-C 04/23/19 1712    Geoffery Lyons, MD 04/23/19 2045

## 2019-04-23 NOTE — Discharge Instructions (Signed)
You can use maalox or mylanta to help with your symptoms. Follow up with your primary care provider  in 2 days.  Return to the ER for new or worsening symptoms; including worsening chest pain, shortness of breath, pain that radiates to the arm or neck, pain or shortness of breath worsened with exertion.

## 2019-04-24 ENCOUNTER — Encounter: Payer: Self-pay | Admitting: Family Medicine

## 2019-05-22 DIAGNOSIS — M19032 Primary osteoarthritis, left wrist: Secondary | ICD-10-CM | POA: Diagnosis not present

## 2019-09-30 IMAGING — MG DIGITAL SCREENING BILATERAL MAMMOGRAM WITH IMPLANTS, CAD AND TOM
8 of 12 series · 8 of 28 positions shown · non-contrast
Comparison: Previous exam(s).

CLINICAL DATA: Screening.

EXAM:
DIGITAL SCREENING BILATERAL MAMMOGRAM WITH IMPLANTS, CAD AND TOMO
The patient has implants. Standard and implant displaced views were
performed.

[L CC]
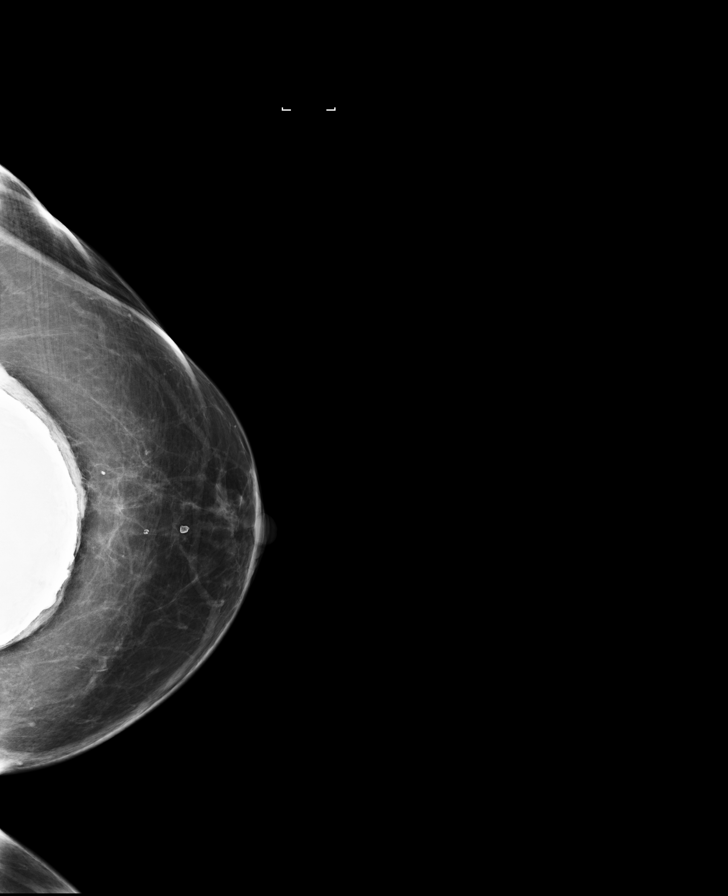

[R CC]
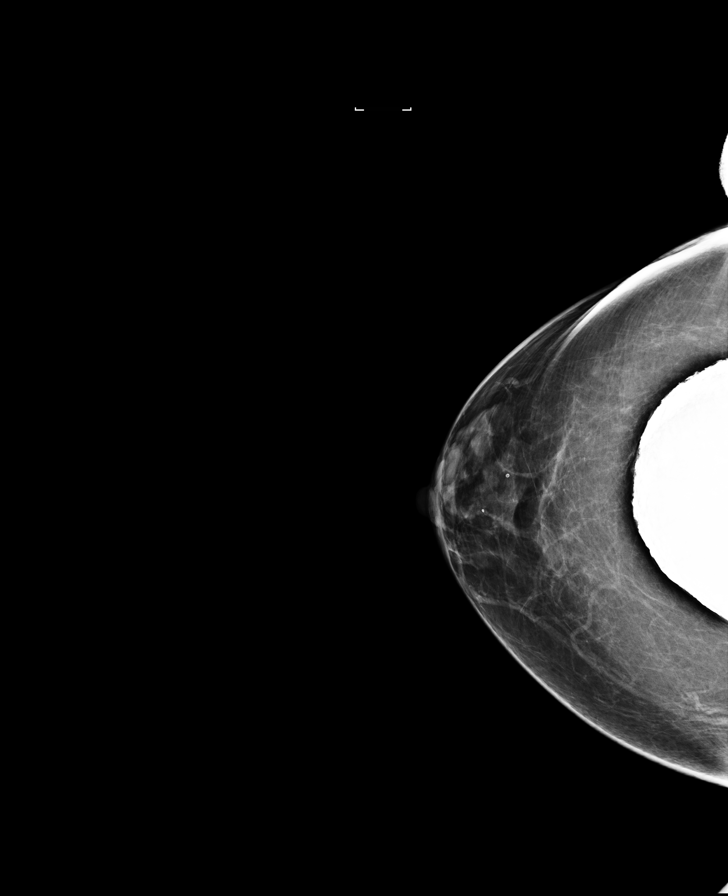

[L MLO]
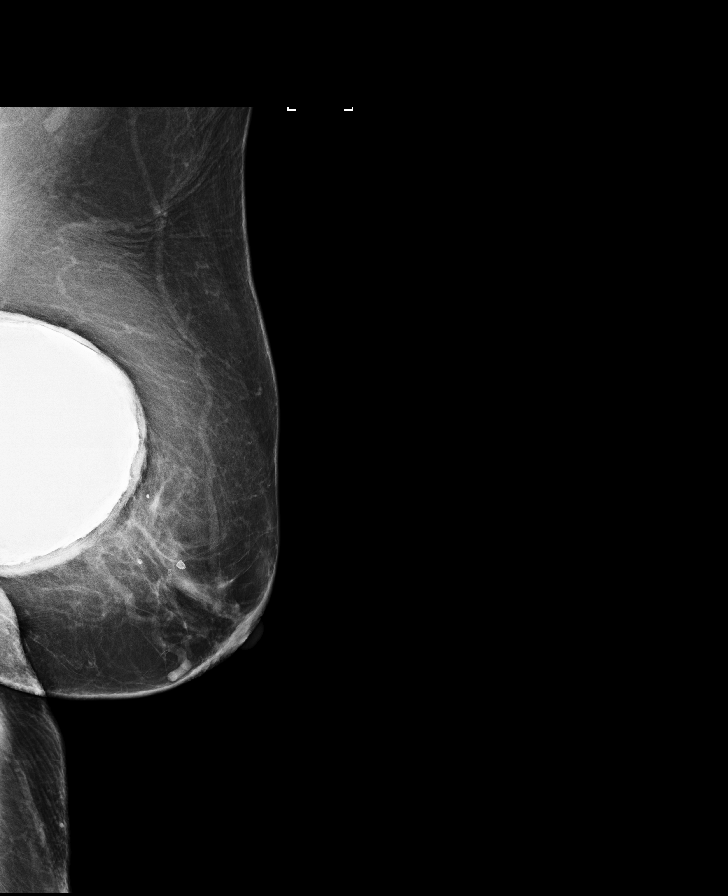

[R MLO]
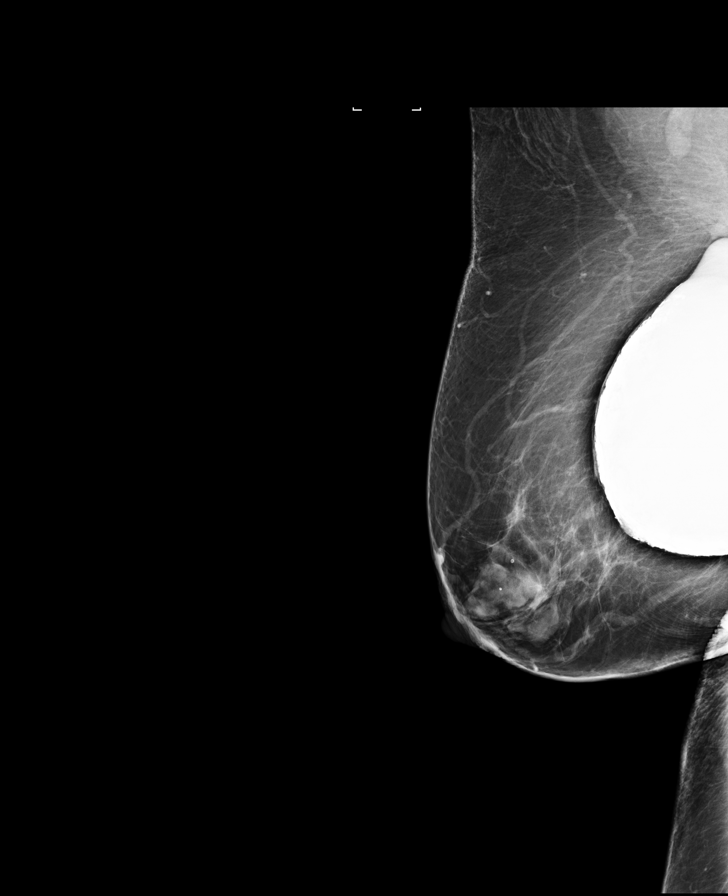

[R CC synth-2D]
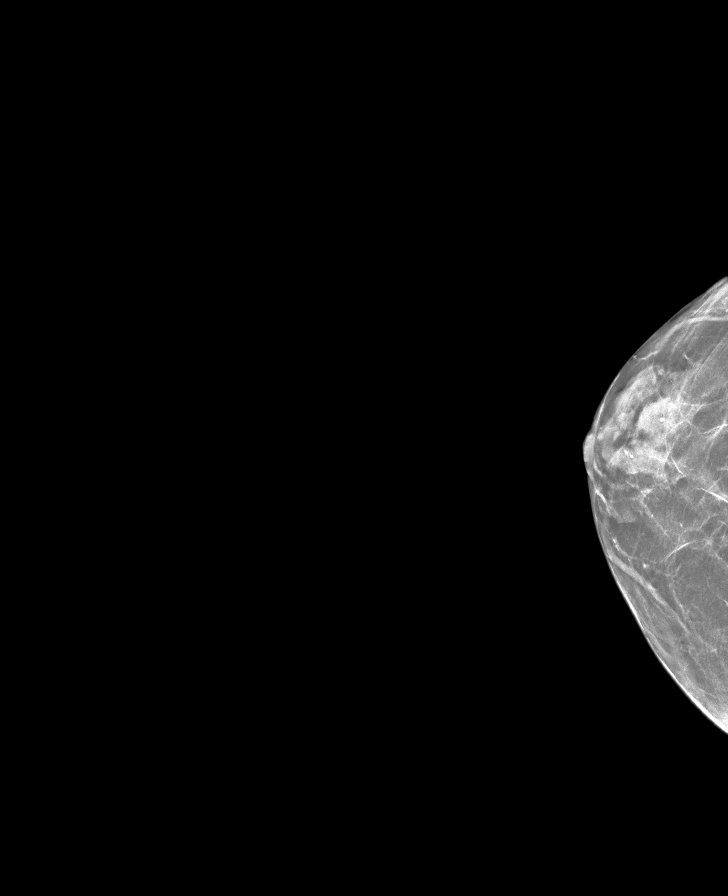

[L MLO synth-2D]
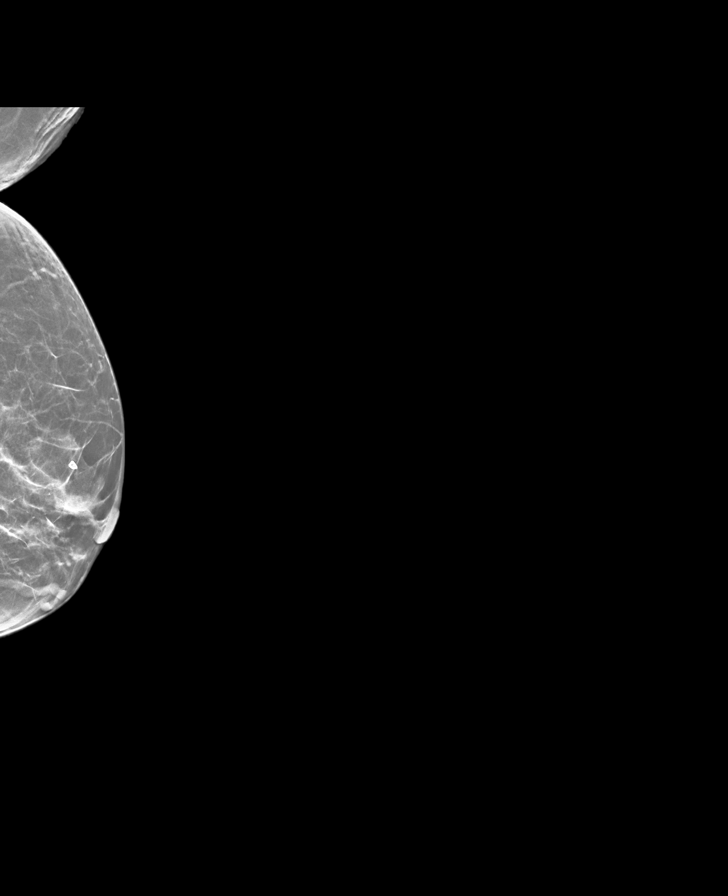

[L CC synth-2D]
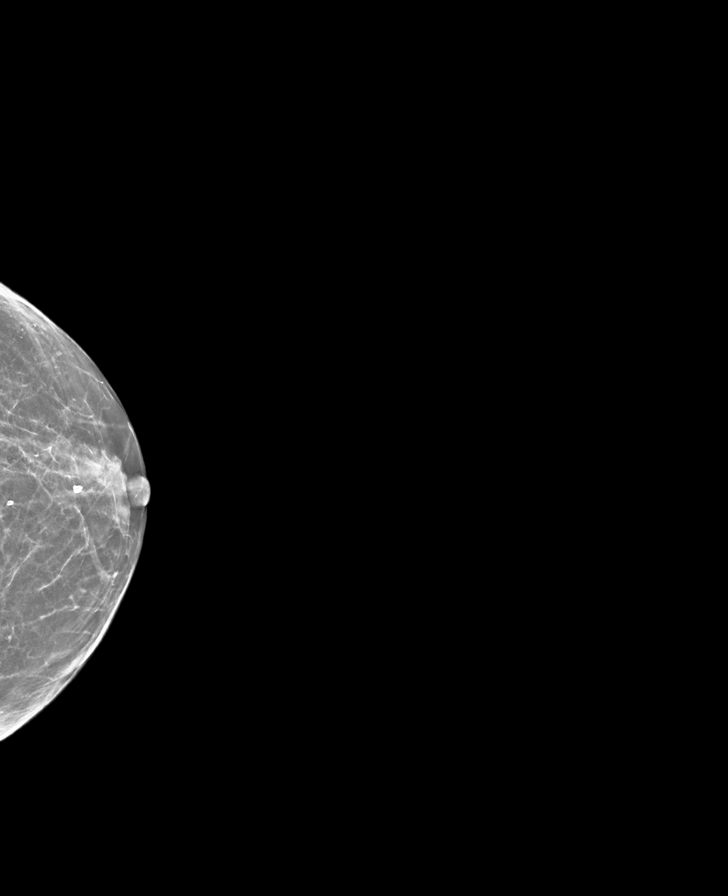

[R MLO synth-2D]
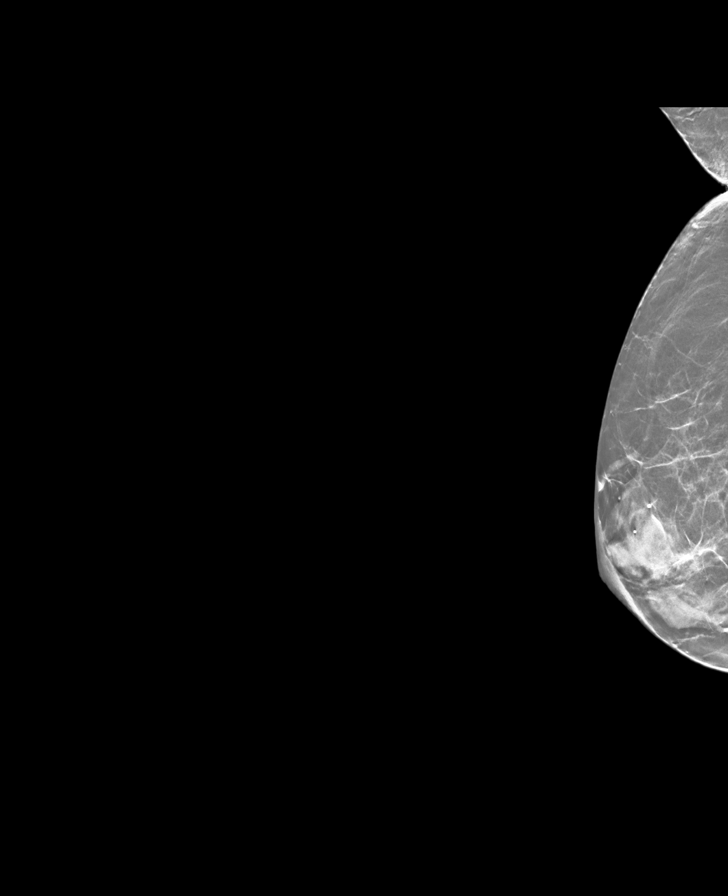

[8 of 28 positions shown; findings below may reference images not displayed]

ACR Breast Density Category b: There are scattered areas of
fibroglandular density.
FINDINGS: There are no findings suspicious for malignancy. Images were
processed with CAD.
IMPRESSION: No mammographic evidence of malignancy. A result letter of this
screening mammogram will be mailed directly to the patient.

RECOMMENDATION:
Screening mammogram in one year. (Code:[id number])

BI-RADS CATEGORY  1:  Negative.

## 2019-10-11 ENCOUNTER — Other Ambulatory Visit: Payer: Self-pay | Admitting: Family Medicine

## 2019-10-11 ENCOUNTER — Other Ambulatory Visit: Payer: Self-pay

## 2019-10-11 DIAGNOSIS — I10 Essential (primary) hypertension: Secondary | ICD-10-CM

## 2019-10-11 MED ORDER — LOSARTAN POTASSIUM-HCTZ 100-25 MG PO TABS: 1.0000 | ORAL_TABLET | Freq: Every day | ORAL | 2 refills | Status: DC

## 2020-01-18 ENCOUNTER — Telehealth: Payer: Self-pay | Admitting: Family Medicine

## 2020-01-18 DIAGNOSIS — M19071 Primary osteoarthritis, right ankle and foot: Secondary | ICD-10-CM

## 2020-01-18 DIAGNOSIS — I10 Essential (primary) hypertension: Secondary | ICD-10-CM

## 2020-01-18 DIAGNOSIS — M19072 Primary osteoarthritis, left ankle and foot: Secondary | ICD-10-CM

## 2020-01-18 MED ORDER — LOSARTAN POTASSIUM-HCTZ 100-25 MG PO TABS: 1.0000 | ORAL_TABLET | Freq: Every day | ORAL | 0 refills | Status: DC

## 2020-01-18 NOTE — Progress Notes (Addendum)
[facility] [address], [address] [place], [place] [address] [phone]  Date:  01/21/2020   Name:  Jennifer Gonzalez   DOB:  [birth year]   MRN:  [id number]  PCP:  Pearline Cables, MD    Chief Complaint: No chief complaint on file.   History of Present Illness:  Burnell Matlin is a 74 y.o. very pleasant female patient who presents with the following:  Pt with history of OA, HTN, hyperlipidemia here today for a CPE Last seen by myself in January of this year  At that time there was some concern of an autoimmune disorder- labs were reassuring She has been seen by ortho -she is using voltaren po prn. She was just seen once and would like me to refill her voltaren   covid series- suggest booster Colon cancer screening- she declines at this time  Flu vaccine- pt declines  shingrix Most recent labs 04/23/19- can do lipids and CMP today    Patient Active Problem List   Diagnosis Date Noted  . Essential hypertension 02/21/2017  . Mixed hyperlipidemia 02/21/2017  . 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 Arthritis   . Chicken pox   . High cholesterol   . Hypertension   . Seasonal allergies     Past Surgical History:  Procedure Laterality Date  . APPENDECTOMY  1969  . AUGMENTATION MAMMAPLASTY    . BREAST ENHANCEMENT SURGERY  1980  . CESAREAN SECTION  1969    Social History   Tobacco Use  . Smoking status: Never Smoker  . Smokeless tobacco: Never Used  Vaping Use  . Vaping Use: Never used  Substance Use Topics  . Alcohol use: Yes    Comment: daily  . Drug use: No    Family History  Problem Relation Age of Onset  . Cancer Mother   . Early death Mother   . Hypertension Mother   . Alcohol abuse Father   . Arthritis Father   . Asthma Father   . COPD Father   . Heart disease Father   . Hypertension Father   . Asthma Daughter     Allergies   Allergen Reactions  . Amlodipine     Dizziness   . Aspirin Tinitus  . Citrullus Vulgaris Itching  . Clindamycin Other (See Comments)    Esophageal inflammation    . Codeine Other (See Comments)    Hallucinations    . Diphenhydramine Hcl (Sleep) Other (See Comments)    unknown unknown   . Loratadine Other (See Comments)    unknown   . Metronidazole Itching  . Penicillins Itching  . Pineapple Itching  . Tramadol Other (See Comments)    woozie feeling     Medication list has been reviewed and updated.  Current Outpatient Medications on File Prior to Visit  Medication Sig Dispense Refill  . acetaminophen (TYLENOL 8 HOUR ARTHRITIS PAIN) 650 MG CR tablet Take 650 mg by mouth 2 (two) times daily as needed for pain.    . Ascorbic Acid (VITAMIN C) 1000 MG tablet Take 1,000 mg by mouth.    . chlorpheniramine (CHLOR-TRIMETON) 4 MG tablet Take 1 tablet by mouth 2 (two) times daily.    . Cholecalciferol (VITAMIN D3) 2000 units TABS Take 1 tablet by mouth daily.    . diclofenac (VOLTAREN) 75 MG EC tablet Take 75 mg by mouth daily.    Marland Kitchen  Multiple Vitamin (MULTIVITAMIN) capsule Take 1 capsule by mouth daily.    Marland Kitchen OVER THE COUNTER MEDICATION Take 1,000 mg by mouth 2 (two) times daily. TURMERIC     . OVER THE COUNTER MEDICATION Take 750 mg by mouth daily. Mega Red    . Probiotic CAPS Take 1 capsule by mouth daily.     No current facility-administered medications on file prior to visit.    Review of Systems:  As per HPI- otherwise negative.   Physical Examination: Vitals:   01/21/20 1355 01/21/20 1420  BP: 130/82   Pulse: 100 90  Resp: 15   SpO2: 97%    Vitals:   01/21/20 1355  Weight: 161 lb (73 kg)  Height: 5\' 5"  (1.651 m)   Body mass index is 26.79 kg/m. Ideal Body Weight: Weight in (lb) to have BMI = 25: 149.9  GEN: no acute distress.  Minimal overweight, looks well  HEENT: Atraumatic, Normocephalic.  Ears and Nose: No external deformity. CV: RRR, No M/G/R. No  JVD. No thrill. No extra heart sounds. PULM: CTA B, no wheezes, crackles, rhonchi. No retractions. No resp. distress. No accessory muscle use. ABD: S, NT, ND, +BS. No rebound. No HSM. EXTR: No c/c/e PSYCH: Normally interactive. Conversant.    Assessment and Plan: Essential hypertension - Plan: losartan-hydrochlorothiazide (HYZAAR) 100-25 MG tablet, Comprehensive metabolic panel, CANCELED: Comprehensive metabolic panel  Mixed hyperlipidemia - Plan: Lipid panel, CANCELED: Lipid panel  Primary osteoarthritis involving multiple joints - Plan: diclofenac (VOLTAREN) 75 MG EC tablet  Family history of Hashimoto thyroiditis - Plan: TSH  Following up today BP under good control -continue current regimen Labs are pending as above She was given diclofenac for osteoarthritis per orthopedics.  This has been helpful for her, she would like to continue taking it There was an incident where her terrier accidentally ate one of her Voltaren recently, this landed him in the emergency vet but thankfully he recovered! We will check thyroid today We discussed a COVID-19 booster, she plans to discuss this with COVID-19 clinic at our pharmacy today This visit occurred during the SARS-CoV-2 public health emergency.  Safety protocols were in place, including screening questions prior to the visit, additional usage of staff PPE, and extensive cleaning of exam room while observing appropriate contact time as indicated for disinfecting solutions.    Signed Bertram Denver, MD  Addendum 11/2, received labs as below.  Message to patient  Thyroid in normal range Your metabolic profile shows a few minor abnormalities Your BUN and creatinine are elevated; this may be due to dehydration or another temporary kidney insult, Also, your calcium is minimally high Very slight elevation of ALT is likely not of concern  Your cholesterol is also higher than I would like If you like, I am glad to prescribe a  cholesterol medication  I would like to recheck your kidney function and calcium level in about 1 month-this can be done as a lab visit only.  I will also check your parathyroid hormone level; if your parathyroid glands become overactive it can cause elevated calcium  Please call and schedule a lab visit only in about 4 weeks.  There is no need to fast prior to this test, please be sure to hydrate well!    Otherwise we can plan to visit in 6 months  Results for orders placed or performed in visit on 01/21/20  TSH  Result Value Ref Range   TSH 1.91 0.40 - 4.50 mIU/L  Comprehensive metabolic  panel  Result Value Ref Range   Glucose, Bld 112 (H) 65 - 99 mg/dL   BUN 34 (H) 7 - 25 mg/dL   Creat 7.82 (H) 9.56 - 0.93 mg/dL   BUN/Creatinine Ratio 26 (H) 6 - 22 (calc)   Sodium 142 135 - 146 mmol/L   Potassium 5.2 3.5 - 5.3 mmol/L   Chloride 104 98 - 110 mmol/L   CO2 27 20 - 32 mmol/L   Calcium 10.5 (H) 8.6 - 10.4 mg/dL   Total Protein 7.0 6.1 - 8.1 g/dL   Albumin 4.8 3.6 - 5.1 g/dL   Globulin 2.2 1.9 - 3.7 g/dL (calc)   AG Ratio 2.2 1.0 - 2.5 (calc)   Total Bilirubin 0.5 0.2 - 1.2 mg/dL   Alkaline phosphatase (APISO) 51 37 - 153 U/L   AST 28 10 - 35 U/L   ALT 34 (H) 6 - 29 U/L  Lipid panel  Result Value Ref Range   Cholesterol 281 (H) <200 mg/dL   HDL 83 > OR = 50 mg/dL   Triglycerides 213 (H) <150 mg/dL   LDL Cholesterol (Calc) 153 (H) mg/dL (calc)   Total CHOL/HDL Ratio 3.4 <5.0 (calc)   Non-HDL Cholesterol (Calc) 198 (H) <130 mg/dL (calc)   Losartan/HCTZ Voltaren

## 2020-01-18 NOTE — Telephone Encounter (Signed)
Medication: losartan-hydrochlorothiazide (HYZAAR) 100-25 MG tablet [[id number]]       Has the patient contacted their pharmacy?  (If no, request that the patient contact the pharmacy for the refill.) (If yes, when and what did the pharmacy advise?)     Preferred Pharmacy (with phone number or street name):  [facility] [place], [place] - [address]  [address], [place] [address]  Phone:  [phone] Fax:  [phone]     Agent: Please be advised that RX refills may take up to 3 business days. We ask that you follow-up with your pharmacy.

## 2020-01-18 NOTE — Patient Instructions (Addendum)
Good to see you again today!    I will be in touch with your labs asap Your BP looks fine- continue current medications

## 2020-01-18 NOTE — Telephone Encounter (Signed)
Medication sent to pharmacy. Patient needs OV for further refills.

## 2020-01-21 ENCOUNTER — Ambulatory Visit (INDEPENDENT_AMBULATORY_CARE_PROVIDER_SITE_OTHER): Payer: Medicare Other | Admitting: Family Medicine

## 2020-01-21 ENCOUNTER — Encounter: Payer: Self-pay | Admitting: Family Medicine

## 2020-01-21 ENCOUNTER — Other Ambulatory Visit: Payer: Self-pay

## 2020-01-21 VITALS — BP 130/82 | HR 90 | Resp 15 | Ht 65.0 in | Wt 161.0 lb

## 2020-01-21 DIAGNOSIS — Z8349 Family history of other endocrine, nutritional and metabolic diseases: Secondary | ICD-10-CM | POA: Diagnosis not present

## 2020-01-21 DIAGNOSIS — Z0000 Encounter for general adult medical examination without abnormal findings: Secondary | ICD-10-CM

## 2020-01-21 DIAGNOSIS — Z13220 Encounter for screening for lipoid disorders: Secondary | ICD-10-CM | POA: Diagnosis not present

## 2020-01-21 DIAGNOSIS — E782 Mixed hyperlipidemia: Secondary | ICD-10-CM

## 2020-01-21 DIAGNOSIS — M8949 Other hypertrophic osteoarthropathy, multiple sites: Secondary | ICD-10-CM

## 2020-01-21 DIAGNOSIS — M159 Polyosteoarthritis, unspecified: Secondary | ICD-10-CM

## 2020-01-21 DIAGNOSIS — I10 Essential (primary) hypertension: Secondary | ICD-10-CM

## 2020-01-21 DIAGNOSIS — Z1329 Encounter for screening for other suspected endocrine disorder: Secondary | ICD-10-CM

## 2020-01-21 DIAGNOSIS — Z13228 Encounter for screening for other metabolic disorders: Secondary | ICD-10-CM

## 2020-01-21 MED ORDER — LOSARTAN POTASSIUM-HCTZ 100-25 MG PO TABS: 1.0000 | ORAL_TABLET | Freq: Every day | ORAL | 3 refills | Status: DC

## 2020-01-21 MED ORDER — DICLOFENAC SODIUM 75 MG PO TBEC: 75.0000 mg | DELAYED_RELEASE_TABLET | Freq: Two times a day (BID) | ORAL | 1 refills | Status: DC

## 2020-01-22 ENCOUNTER — Encounter: Payer: Self-pay | Admitting: Family Medicine

## 2020-01-22 LAB — LIPID PANEL
Cholesterol: 281 mg/dL — ABNORMAL HIGH (ref <200)
HDL: 83 mg/dL (ref >=50)
LDL Cholesterol (Calc): 153 mg/dL (calc) — ABNORMAL HIGH
Non-HDL Cholesterol (Calc): 198 mg/dL (calc) — ABNORMAL HIGH (ref <130)
Total CHOL/HDL Ratio: 3.4 (calc) (ref <5.0)
Triglycerides: 275 mg/dL — ABNORMAL HIGH (ref <150)

## 2020-01-22 LAB — COMPREHENSIVE METABOLIC PANEL
AG Ratio: 2.2 (calc) (ref 1.0–2.5)
ALT: 34 U/L — ABNORMAL HIGH (ref 6–29)
AST: 28 U/L (ref 10–35)
Albumin: 4.8 g/dL (ref 3.6–5.1)
Alkaline phosphatase (APISO): 51 U/L (ref 37–153)
BUN/Creatinine Ratio: 26 (calc) — ABNORMAL HIGH (ref 6–22)
BUN: 34 mg/dL — ABNORMAL HIGH (ref 7–25)
CO2: 27 mmol/L (ref 20–32)
Calcium: 10.5 mg/dL — ABNORMAL HIGH (ref 8.6–10.4)
Chloride: 104 mmol/L (ref 98–110)
Creat: 1.31 mg/dL — ABNORMAL HIGH (ref 0.60–0.93)
Globulin: 2.2 g/dL (calc) (ref 1.9–3.7)
Glucose, Bld: 112 mg/dL — ABNORMAL HIGH (ref 65–99)
Potassium: 5.2 mmol/L (ref 3.5–5.3)
Sodium: 142 mmol/L (ref 135–146)
Total Bilirubin: 0.5 mg/dL (ref 0.2–1.2)
Total Protein: 7 g/dL (ref 6.1–8.1)

## 2020-01-22 LAB — TSH: TSH: 1.91 mIU/L (ref 0.40–4.50)

## 2020-01-22 NOTE — Addendum Note (Signed)
Addended by: Pearline Cables on: 01/22/2020 06:37 AM   Modules accepted: Orders

## 2020-04-01 NOTE — Progress Notes (Addendum)
[name] [address], [address] [place], [place] [address] [phone]  Date:  04/03/2020   Name:  Jennifer Gonzalez   DOB:  09-Aug-1945   MRN:  [id number]  PCP:  Pearline Cables, MD    Chief Complaint: Peripheral Neuropathy   History of Present Illness:  Jennifer Gonzalez is a 75 y.o. very pleasant female patient who presents with the following:  Pt is here today for a follow-up visit History of HTN, hyperlipidemia, OA Last seen by myself 11/1 for CPE  - history of OA, HTN, hyperlipidemia  She has been feeling well She is trying to quarantine and is bored  She declines a flu shot  She did not get a covid booster  She needs a repeat BMP to check calcium and renal function, as well as PTH  I had suggested starting on a lipid medication -she would like to recheck cholesterol today while strictly fasting  She is fasting today   She has noted tingling, hot feeling in her right hand from the elbow for about 6 months, getting worse Now having some tingling in her left fingertips as well Sx are worse at night when she is in bed  She does note a history of neck issues and pain Nothing in her feet She does not feel like she has weakness in her hand but she does have some numbness She has dropped 2 beverages  She is able to do MRI if needed   Patient Active Problem List   Diagnosis Date Noted  . Essential hypertension 02/21/2017  . Mixed hyperlipidemia 02/21/2017  . 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 Arthritis   . Chicken pox   . High cholesterol   . Hypertension   . Seasonal allergies     Past Surgical History:  Procedure Laterality Date  . APPENDECTOMY  1969  . AUGMENTATION MAMMAPLASTY    . BREAST ENHANCEMENT SURGERY  1980  . CESAREAN SECTION  1969    Social History   Tobacco Use  . Smoking status: Never Smoker  . Smokeless tobacco: Never  Used  Vaping Use  . Vaping Use: Never used  Substance Use Topics  . Alcohol use: Yes    Comment: daily  . Drug use: No    Family History  Problem Relation Age of Onset  . Cancer Mother   . Early death Mother   . Hypertension Mother   . Alcohol abuse Father   . Arthritis Father   . Asthma Father   . COPD Father   . Heart disease Father   . Hypertension Father   . Asthma Daughter     Allergies  Allergen Reactions  . Amlodipine     Dizziness   . Aspirin Tinitus  . Citrullus Vulgaris Itching  . Clindamycin Other (See Comments)    Esophageal inflammation    . Codeine Other (See Comments)    Hallucinations    . Diphenhydramine Hcl (Sleep) Other (See Comments)    unknown unknown   . Loratadine Other (See Comments)    unknown   . Metronidazole Itching  . Penicillins Itching  . Pineapple Itching  . Tramadol Other (See Comments)    woozie feeling     Medication list has been reviewed and updated.  Current Outpatient Medications on File Prior to Visit  Medication Sig Dispense Refill  . acetaminophen (TYLENOL) 650 MG CR  tablet Take 650 mg by mouth 2 (two) times daily as needed for pain.    . Ascorbic Acid (VITAMIN C) 1000 MG tablet Take 1,000 mg by mouth.    . chlorpheniramine (CHLOR-TRIMETON) 4 MG tablet Take 1 tablet by mouth 2 (two) times daily.    . Cholecalciferol (VITAMIN D3) 2000 units TABS Take 1 tablet by mouth daily.    . diclofenac (VOLTAREN) 75 MG EC tablet Take 75 mg by mouth daily.    Marland Kitchen losartan-hydrochlorothiazide (HYZAAR) 100-25 MG tablet Take 1 tablet by mouth daily. 90 tablet 3  . Multiple Vitamin (MULTIVITAMIN) capsule Take 1 capsule by mouth daily.    Marland Kitchen OVER THE COUNTER MEDICATION Take 1,000 mg by mouth 2 (two) times daily. TURMERIC    . OVER THE COUNTER MEDICATION Take 750 mg by mouth daily. Mega Red    . Probiotic CAPS Take 1 capsule by mouth daily.     No current facility-administered medications on file prior to visit.    Review of  Systems:  As per HPI- otherwise negative.   Physical Examination: Vitals:   04/03/20 0838  BP: 132/82  Pulse: (!) 104  Resp: 17  SpO2: 97%   Vitals:   04/03/20 0838  Weight: 162 lb (73.5 kg)  Height: 5\' 5"  (1.651 m)   Body mass index is 26.96 kg/m. Ideal Body Weight: Weight in (lb) to have BMI = 25: 149.9  GEN: no acute distress. HEENT: Atraumatic, Normocephalic.  Ears and Nose: No external deformity. CV: RRR, No M/G/R. No JVD. No thrill. No extra heart sounds. PULM: CTA B, no wheezes, crackles, rhonchi. No retractions. No resp. distress. No accessory muscle use. EXTR: No c/c/e PSYCH: Normally interactive. Conversant.  Normal strength of both UE to exam today, normal sensation bilaterally  Somewhat restricted cervical extension and rotation to either side   Assessment and Plan: Essential hypertension - Plan: Basic metabolic panel  Mixed hyperlipidemia - Plan: Lipid panel  Serum calcium elevated - Plan: PTH, Intact and Calcium  Neck pain - Plan: DG Cervical Spine Complete  Numbness and tingling in right hand - Plan: DG Cervical Spine Complete  Pt with concern of hyperlipidemia- recheck fasting lipids today Elevated calcium- check Ca and PTH Concern of numbness in her hands- right more than left- for 6 months or so.  intermittent more at night.  Has dropped a cup twice with her right hand .  Pt suspects a neck issue due to history of gymnastics as a youth and some neck pain.  Will obtain plain films today- suspect will proceed to MRI  Will plan further follow- up pending labs.   Signed , MD  Received her cervical spine films as follows-message to patient DG Cervical Spine Complete  Result Date: 04/03/2020 CLINICAL DATA:  Cervicalgia with upper extremity tingling EXAM: CERVICAL SPINE - COMPLETE 4+ VIEW COMPARISON:  None. FINDINGS: Frontal, lateral, open-mouth odontoid, and bilateral oblique views were obtained. There is no fracture or  spondylolisthesis. Prevertebral soft tissues and predental space regions are normal. There is severe disc space narrowing at C5-6 and C6-7 with moderate disc space narrowing at C4-5. Mild disc space narrowing noted at other levels. There is facet hypertrophy with exit foraminal narrowing at C4-5 on the left, at C5-6 bilaterally, and at C6-7 on the left. Lung apices are clear. There is calcification in the right carotid artery. IMPRESSION: Multilevel arthropathy. No fracture or spondylolisthesis. Right carotid artery calcification. Electronically Signed   By: 04/05/2020 III M.D.  On: 04/03/2020 09:52   Received her labs as below, 1/15 BUN stable with slight decrease of GFR Triglycerides better fasting blood unfortunately cholesterol not overall improved Results for orders placed or performed in visit on 04/03/20  Basic metabolic panel  Result Value Ref Range   Sodium 139 135 - 145 mEq/L   Potassium 4.5 3.5 - 5.1 mEq/L   Chloride 102 96 - 112 mEq/L   CO2 30 19 - 32 mEq/L   Glucose, Bld 108 (H) 70 - 99 mg/dL   BUN 30 (H) 6 - 23 mg/dL   Creatinine, Ser 0.08 0.40 - 1.20 mg/dL   GFR 67.61 (L) >95.09 mL/min   Calcium 10.1 8.4 - 10.5 mg/dL  PTH, Intact and Calcium  Result Value Ref Range   PTH 41 14 - 64 pg/mL   Calcium 10.0 8.6 - 10.4 mg/dL  Lipid panel  Result Value Ref Range   Cholesterol 301 (H) 0 - 200 mg/dL   Triglycerides 326.7 (H) 0.0 - 149.0 mg/dL   HDL 12.45 >80.99 mg/dL   VLDL 83.3 0.0 - 82.5 mg/dL   LDL Cholesterol 053 (H) 0 - 99 mg/dL   Total CHOL/HDL Ratio 4    NonHDL 218.66

## 2020-04-01 NOTE — Patient Instructions (Addendum)
Good to see you again today - we will get labs today to check on your calcium and paraththyroid hormone levels We will check your lipids again Plain x-rays of your neck today - I suspect we may proceed to an MRI to further evaluate your numbness and tingling in your arms

## 2020-04-03 ENCOUNTER — Ambulatory Visit (INDEPENDENT_AMBULATORY_CARE_PROVIDER_SITE_OTHER): Payer: Medicare Other | Admitting: Family Medicine

## 2020-04-03 ENCOUNTER — Encounter: Payer: Self-pay | Admitting: Family Medicine

## 2020-04-03 ENCOUNTER — Ambulatory Visit (HOSPITAL_BASED_OUTPATIENT_CLINIC_OR_DEPARTMENT_OTHER)
Admission: RE | Admit: 2020-04-03 | Discharge: 2020-04-03 | Disposition: A | Discharge status: Home / Self Care | Payer: Medicare Other | Source: Ambulatory Visit | Attending: Family Medicine | Admitting: Family Medicine

## 2020-04-03 ENCOUNTER — Other Ambulatory Visit: Payer: Self-pay

## 2020-04-03 VITALS — BP 132/82 | HR 90 | Resp 17 | Ht 65.0 in | Wt 162.0 lb

## 2020-04-03 DIAGNOSIS — R200 Anesthesia of skin: Secondary | ICD-10-CM

## 2020-04-03 DIAGNOSIS — E782 Mixed hyperlipidemia: Secondary | ICD-10-CM

## 2020-04-03 DIAGNOSIS — R799 Abnormal finding of blood chemistry, unspecified: Secondary | ICD-10-CM

## 2020-04-03 DIAGNOSIS — I6521 Occlusion and stenosis of right carotid artery: Secondary | ICD-10-CM

## 2020-04-03 DIAGNOSIS — I10 Essential (primary) hypertension: Secondary | ICD-10-CM

## 2020-04-03 DIAGNOSIS — R202 Paresthesia of skin: Secondary | ICD-10-CM

## 2020-04-03 DIAGNOSIS — M542 Cervicalgia: Secondary | ICD-10-CM | POA: Insufficient documentation

## 2020-04-03 DIAGNOSIS — M47812 Spondylosis without myelopathy or radiculopathy, cervical region: Secondary | ICD-10-CM | POA: Diagnosis not present

## 2020-04-03 DIAGNOSIS — M4802 Spinal stenosis, cervical region: Secondary | ICD-10-CM | POA: Diagnosis not present

## 2020-04-03 LAB — BASIC METABOLIC PANEL
BUN: 30 mg/dL — ABNORMAL HIGH (ref 6–23)
CO2: 30 mEq/L (ref 19–32)
Calcium: 10.1 mg/dL (ref 8.4–10.5)
Chloride: 102 mEq/L (ref 96–112)
Creatinine, Ser: 1.03 mg/dL (ref 0.40–1.20)
GFR: 53.26 mL/min — ABNORMAL LOW (ref >60.00)
Glucose, Bld: 108 mg/dL — ABNORMAL HIGH (ref 70–99)
Potassium: 4.5 mEq/L (ref 3.5–5.1)
Sodium: 139 mEq/L (ref 135–145)

## 2020-04-03 LAB — LIPID PANEL
Cholesterol: 301 mg/dL — ABNORMAL HIGH (ref 0–200)
HDL: 82.3 mg/dL (ref >39.00)
LDL Cholesterol: 183 mg/dL — ABNORMAL HIGH (ref 0–99)
NonHDL: 218.66
Total CHOL/HDL Ratio: 4
Triglycerides: 176 mg/dL — ABNORMAL HIGH (ref 0.0–149.0)
VLDL: 35.2 mg/dL (ref 0.0–40.0)

## 2020-04-03 NOTE — Addendum Note (Signed)
Addended by: Abbe Amsterdam C on: 04/03/2020 04:34 PM   Modules accepted: Orders

## 2020-04-04 LAB — PTH, INTACT AND CALCIUM
Calcium: 10 mg/dL (ref 8.6–10.4)
PTH: 41 pg/mL (ref 14–64)

## 2020-04-05 ENCOUNTER — Encounter: Payer: Self-pay | Admitting: Family Medicine

## 2020-04-05 NOTE — Addendum Note (Signed)
Addended by: Abbe Amsterdam C on: 04/05/2020 08:00 AM   Modules accepted: Orders

## 2020-04-17 ENCOUNTER — Ambulatory Visit
Admission: RE | Admit: 2020-04-17 | Discharge: 2020-04-17 | Disposition: A | Discharge status: Home / Self Care | Payer: Medicare Other | Source: Ambulatory Visit | Attending: Family Medicine | Admitting: Family Medicine

## 2020-04-17 ENCOUNTER — Encounter: Payer: Self-pay | Admitting: Family Medicine

## 2020-04-17 ENCOUNTER — Other Ambulatory Visit: Payer: Self-pay | Admitting: Family Medicine

## 2020-04-17 ENCOUNTER — Other Ambulatory Visit: Payer: Self-pay

## 2020-04-17 DIAGNOSIS — R202 Paresthesia of skin: Secondary | ICD-10-CM

## 2020-04-17 DIAGNOSIS — R200 Anesthesia of skin: Secondary | ICD-10-CM

## 2020-04-17 DIAGNOSIS — M4802 Spinal stenosis, cervical region: Secondary | ICD-10-CM | POA: Diagnosis not present

## 2020-04-18 ENCOUNTER — Encounter: Payer: Self-pay | Admitting: Family Medicine

## 2020-04-18 ENCOUNTER — Telehealth: Payer: Self-pay

## 2020-04-18 NOTE — Telephone Encounter (Signed)
Who Is Calling Lab Lab Name [facility] Radiology Lab Phone Number [phone] Lab Tech Name Raquel James Lab Reference Number N/a Chief Complaint Lab Result (Critical or Stat) Call Type Lab Send to RN Reason for Call Report lab results Initial Comment Caller states she is Jennifer Gonzalez From [facility] Radiology she need to speak with doctor Additional Comment Finding on a cervical spine Translation No Nurse Assessment Nurse: Long, RN, Erin Date/Time (Eastern Time): 04/17/2020 8:23:44 PM Does the patient have any new or worsening symptoms? ---No Nurse: Long, RN, Erin Date/Time (Eastern Time): 04/17/2020 8:22:43 PM Is there an on-call provider listed? ---Yes Please list name of person reporting value (Lab Employee) and a contact number. ---Raquel James with [facility]iology at [phone] Please document the following items: Lab name Lab value (read back to lab to verify) Reference range for lab value Date and time blood was drawn Collect time of birth for bilirubin results ---Finding on a cervical spine mri, Dr. 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 Spinal signal abnomality at this level compatible with edemal and/or myelomalacia. Sever bilateral Neural and foraminal narrowing. At C5-C6 multifactorial severe spinal cannel stenosis with spinal cord impingement. Severe bilateral neural foraminal narrowing At C6-C7 there is multifactorial severe spinal cannel stenosis. Minimal t2 hyperintensity is questioned within the spinal cord at this level. Which may reflect sublte edema. Bilateral neural foraminal narrowing(moderate/severe right, severe, left) Neurosurgical consultations recommended Disp. Time [name] Time) Disposition Final User 04/17/2020 8:39:39 PM Send To RN Personal Long, RN, Erin 04/17/2020 9:07:18 PM Lab Call Long, RN, Denny Peon Reason: Called lab back. 04/17/2020 9:07:24 PM Clinical Call Yes Long, RN, Erin PLEASE  NOTE: All timestamps contained within this report are represented as [place] Standard Time. CONFIDENTIALTY NOTICE: This fax transmission is intended only for the addressee. It contains information that is legally privileged, confidential or otherwise protected from use or disclosure. If you are not the intended recipient, you are strictly prohibited from reviewing, disclosing, copying using or disseminating any of this information or taking any action in reliance on or regarding this information. If you have received this fax in error, please notify us immediately by telephone so that we can arrange for its return to [place]. Phone: [phone], Toll-Free: [phone], Fax: [phone] Page: 2 of 2 Call Id: [id number] Comments User: Cooper Render, RN Date/Time [name] Time): 04/17/2020 9:06:48 PM per Charge RN, called lab back to let them know if I do not have a phone number for the patient, they still need to notify the office tomorrow. Lab tech Raquel James. verbalized understanding

## 2020-04-18 NOTE — Telephone Encounter (Signed)
Called [facility] regarding her MRI findings Spoke with on call MD Dr. Marcy Siren staff. They will have him review the MRI and then bring pt in as needed for

## 2020-04-22 ENCOUNTER — Other Ambulatory Visit: Payer: Self-pay | Admitting: Family Medicine

## 2020-04-22 ENCOUNTER — Ambulatory Visit (HOSPITAL_BASED_OUTPATIENT_CLINIC_OR_DEPARTMENT_OTHER)
Admission: RE | Admit: 2020-04-22 | Discharge: 2020-04-22 | Disposition: A | Discharge status: Home / Self Care | Payer: Medicare Other | Source: Ambulatory Visit | Attending: Family Medicine | Admitting: Family Medicine

## 2020-04-22 ENCOUNTER — Other Ambulatory Visit: Payer: Self-pay

## 2020-04-22 DIAGNOSIS — I10 Essential (primary) hypertension: Secondary | ICD-10-CM

## 2020-04-22 DIAGNOSIS — I6521 Occlusion and stenosis of right carotid artery: Secondary | ICD-10-CM | POA: Diagnosis not present

## 2020-04-22 DIAGNOSIS — I6523 Occlusion and stenosis of bilateral carotid arteries: Secondary | ICD-10-CM | POA: Diagnosis not present

## 2020-04-23 ENCOUNTER — Encounter: Payer: Self-pay | Admitting: Family Medicine

## 2020-04-25 DIAGNOSIS — G959 Disease of spinal cord, unspecified: Secondary | ICD-10-CM | POA: Diagnosis not present

## 2020-05-09 DIAGNOSIS — G959 Disease of spinal cord, unspecified: Secondary | ICD-10-CM | POA: Diagnosis not present

## 2020-05-09 DIAGNOSIS — Z8669 Personal history of other diseases of the nervous system and sense organs: Secondary | ICD-10-CM | POA: Diagnosis not present

## 2020-05-09 DIAGNOSIS — M47812 Spondylosis without myelopathy or radiculopathy, cervical region: Secondary | ICD-10-CM | POA: Diagnosis not present

## 2020-05-09 DIAGNOSIS — M4802 Spinal stenosis, cervical region: Secondary | ICD-10-CM | POA: Diagnosis not present

## 2020-07-17 DIAGNOSIS — H52203 Unspecified astigmatism, bilateral: Secondary | ICD-10-CM | POA: Diagnosis not present

## 2020-09-25 DIAGNOSIS — L821 Other seborrheic keratosis: Secondary | ICD-10-CM | POA: Diagnosis not present

## 2020-09-25 DIAGNOSIS — L570 Actinic keratosis: Secondary | ICD-10-CM | POA: Diagnosis not present

## 2020-09-25 DIAGNOSIS — D1801 Hemangioma of skin and subcutaneous tissue: Secondary | ICD-10-CM | POA: Diagnosis not present

## 2020-09-25 DIAGNOSIS — L578 Other skin changes due to chronic exposure to nonionizing radiation: Secondary | ICD-10-CM | POA: Diagnosis not present

## 2020-10-19 NOTE — Progress Notes (Signed)
[facility] [address], [address] [place], [place] [address] [phone]  Date:  10/20/2020   Name:  Jennifer Gonzalez   DOB:  11-Dec-1945   MRN:  [id number]  PCP:  Pearline Cables, MD    Chief Complaint: Otalgia (Left/)   History of Present Illness:  Jennifer Gonzalez is a 75 y.o. very pleasant female patient who presents with the following:  Patient seen today with ear concern-she has noticed some left ear pain for about 2 weeks Last seen by myself in January-history of arthritis, hypertension, hyperlipidemia  DEXA scan-  she declines  Mammogram- about 18 months ago, reminded her to update your convenience  Her left ear will hurt, then get better and then return She is wearing her hearing aids- she does not always use them She feels like her hearing is about normal for her The hearing aid feels uncomfortable in her ear Sometimes mild itching of the ear  No drainage  She does tend to have sinus issues and frequent sinus pressure She uses a decongestant daily   No cough No fever   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    Past Medical History:  Diagnosis Date   Arthritis    Chicken pox    High cholesterol    Hypertension    Seasonal allergies     Past Surgical History:  Procedure Laterality Date   APPENDECTOMY  1969   AUGMENTATION MAMMAPLASTY     BREAST ENHANCEMENT SURGERY  1980   CESAREAN SECTION  1969    Social History   Tobacco Use   Smoking status: Never   Smokeless tobacco: Never  Vaping Use   Vaping Use: Never used  Substance Use Topics   Alcohol use: Yes    Comment: daily   Drug use: No    Family History  Problem Relation Age of Onset   Cancer Mother    Early death Mother    Hypertension Mother    Alcohol abuse Father    Arthritis Father     Asthma Father    COPD Father    Heart disease Father    Hypertension Father    Asthma Daughter     Allergies  Allergen Reactions   Amlodipine     Dizziness    Aspirin Tinitus   Citrullus Vulgaris Itching   Clindamycin Other (See Comments)    Esophageal inflammation     Codeine Other (See Comments)    Hallucinations     Diphenhydramine Hcl (Sleep) Other (See Comments)    unknown unknown    Loratadine Other (See Comments)    unknown    Metronidazole Itching   Penicillins Itching   Pineapple Itching   Tramadol Other (See Comments)    woozie feeling     Medication list has been reviewed and updated.  Current Outpatient Medications on File Prior to Visit  Medication Sig Dispense Refill   acetaminophen (TYLENOL) 650 MG CR tablet Take 650 mg by mouth 2 (two) times daily as needed for pain.     Ascorbic Acid (VITAMIN C) 1000 MG tablet Take 1,000 mg by mouth.     chlorpheniramine (CHLOR-TRIMETON) 4 MG tablet Take 1 tablet by mouth 2 (two) times daily.     Cholecalciferol (VITAMIN D3) 2000 units TABS Take 1 tablet by mouth daily.  diclofenac (VOLTAREN) 75 MG EC tablet Take 75 mg by mouth daily.     losartan-hydrochlorothiazide (HYZAAR) 100-25 MG tablet Take 1 tablet by mouth daily. 90 tablet 1   Multiple Vitamin (MULTIVITAMIN) capsule Take 1 capsule by mouth daily.     OVER THE COUNTER MEDICATION Take 1,000 mg by mouth 2 (two) times daily. TURMERIC     OVER THE COUNTER MEDICATION Take 750 mg by mouth daily. Mega Red     Probiotic CAPS Take 1 capsule by mouth daily.     No current facility-administered medications on file prior to visit.    Review of Systems:  As per HPI- otherwise negative.   Physical Examination: Vitals:   10/20/20 1411  BP: (!) 142/80  Pulse: (!) 101  Resp: 12  Temp: 98 F (36.7 C)  SpO2: 98%   Vitals:   10/20/20 1411  Weight: 162 lb 9.6 oz (73.8 kg)  Height: 5\' 5"  (1.651 m)   Body mass index is 27.06 kg/m. Ideal Body Weight: Weight  in (lb) to have BMI = 25: 149.9  GEN: no acute distress.  Overweight, looks well HEENT: Atraumatic, Normocephalic. Bilateral TM wnl, oropharynx normal.  PEERL,EOMI.   There is a small foreign body in the left ear canal.  It is the soft rubber cover from one of her hearing aids Ears and Nose: No external deformity. CV: RRR, No M/G/R. No JVD. No thrill. No extra heart sounds. PULM: CTA B, no wheezes, crackles, rhonchi. No retractions. No resp. distress. No accessory muscle use. EXTR: No c/c/e PSYCH: Normally interactive. Conversant.   Verbal consent obtained.  Alligator forceps used to carefully remove foreign body from left ear canal.  Patient tolerated the procedure well with no apparent complications.  The TM and ear canal appear normal with perhaps some mild irritation from retained foreign body.  I do not appreciate any defect in the ear canal Assessment and Plan: Foreign body of left ear, initial encounter Foreign body left ear, removed.  Patient will contact me if she continues to have any symptoms r This visit occurred during the SARS-CoV-2 public health emergency.  Safety protocols were in place, including screening questions prior to the visit, additional usage of staff PPE, and extensive cleaning of exam room while observing appropriate contact time as indicated for disinfecting solutions.   Signed , MD

## 2020-10-20 ENCOUNTER — Encounter: Payer: Self-pay | Admitting: Family Medicine

## 2020-10-20 ENCOUNTER — Other Ambulatory Visit (HOSPITAL_BASED_OUTPATIENT_CLINIC_OR_DEPARTMENT_OTHER): Payer: Self-pay | Admitting: Family Medicine

## 2020-10-20 ENCOUNTER — Ambulatory Visit (INDEPENDENT_AMBULATORY_CARE_PROVIDER_SITE_OTHER): Payer: Medicare Other | Admitting: Family Medicine

## 2020-10-20 ENCOUNTER — Other Ambulatory Visit: Payer: Self-pay

## 2020-10-20 VITALS — BP 142/80 | HR 101 | Temp 98.0°F | Resp 12 | Ht 65.0 in | Wt 162.6 lb

## 2020-10-20 DIAGNOSIS — T162XXA Foreign body in left ear, initial encounter: Secondary | ICD-10-CM | POA: Diagnosis not present

## 2020-10-20 DIAGNOSIS — Z1231 Encounter for screening mammogram for malignant neoplasm of breast: Secondary | ICD-10-CM

## 2020-10-20 NOTE — Patient Instructions (Addendum)
We removed a piece of your hearing aid from your left ear today- let me know if your ear does not feel back to normal!    Due for mammogram at your convenience Please consider getting the shingles vaccine series and a covid booster if not done already

## 2020-10-28 ENCOUNTER — Encounter (HOSPITAL_BASED_OUTPATIENT_CLINIC_OR_DEPARTMENT_OTHER): Payer: Self-pay

## 2020-10-28 ENCOUNTER — Ambulatory Visit (HOSPITAL_BASED_OUTPATIENT_CLINIC_OR_DEPARTMENT_OTHER)
Admission: RE | Admit: 2020-10-28 | Discharge: 2020-10-28 | Disposition: A | Discharge status: Home / Self Care | Payer: Medicare Other | Source: Ambulatory Visit | Attending: Family Medicine | Admitting: Family Medicine

## 2020-10-28 ENCOUNTER — Other Ambulatory Visit (HOSPITAL_BASED_OUTPATIENT_CLINIC_OR_DEPARTMENT_OTHER): Payer: Self-pay | Admitting: Family Medicine

## 2020-10-28 ENCOUNTER — Other Ambulatory Visit: Payer: Self-pay

## 2020-10-28 DIAGNOSIS — Z1231 Encounter for screening mammogram for malignant neoplasm of breast: Secondary | ICD-10-CM | POA: Diagnosis not present

## 2020-11-13 ENCOUNTER — Encounter: Payer: Self-pay | Admitting: Family Medicine

## 2020-11-13 DIAGNOSIS — M159 Polyosteoarthritis, unspecified: Secondary | ICD-10-CM

## 2020-11-15 IMAGING — MG DIGITAL SCREENING BREAST BILAT IMPLANT W/ TOMO W/ CAD
8 of 12 series · 8 of 28 positions shown · non-contrast
Comparison: Previous exam(s).

CLINICAL DATA: Screening.

EXAM:
DIGITAL SCREENING BILATERAL MAMMOGRAM WITH IMPLANTS, CAD AND TOMO
The patient has implants. Standard and implant displaced views were
performed.

[L CC]
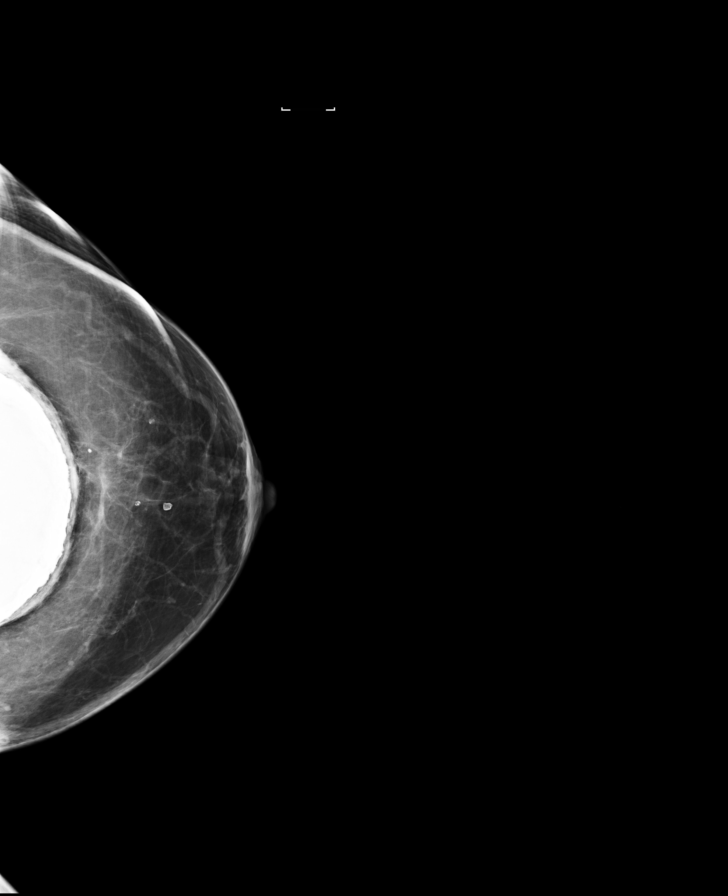

[R CC]
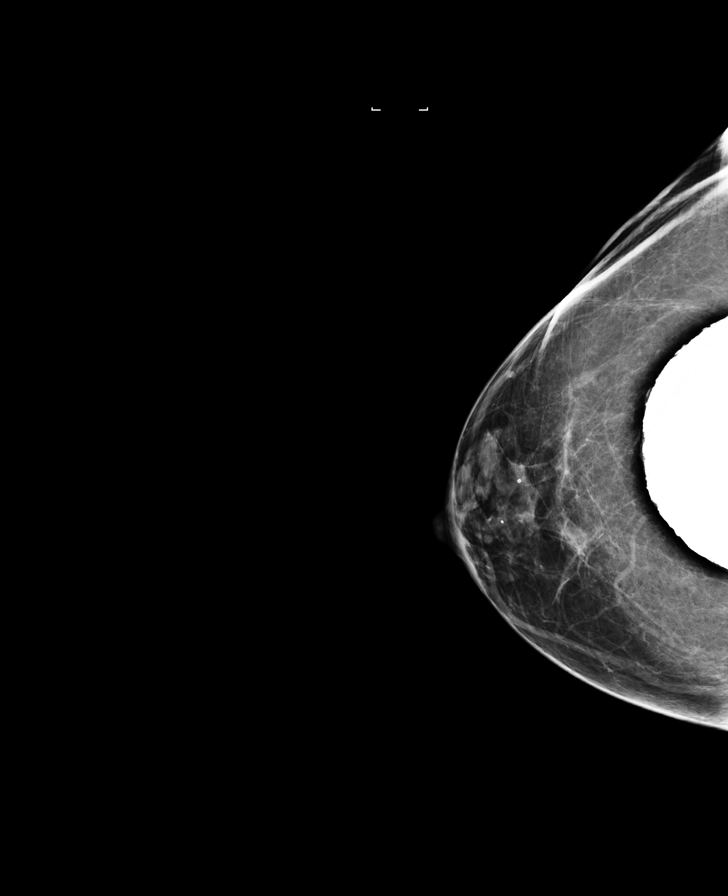

[L MLO]
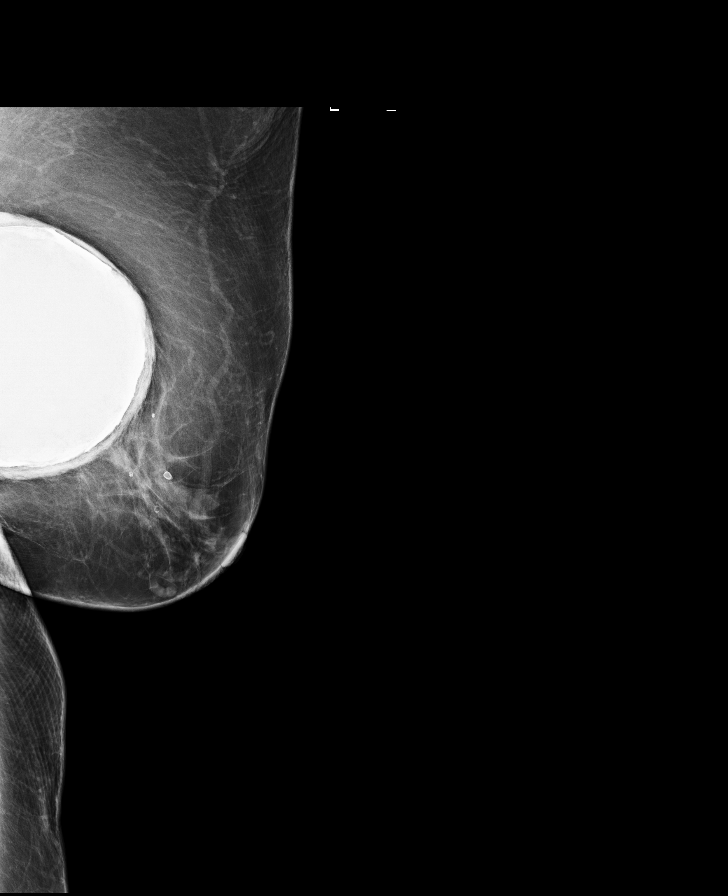

[R MLO]
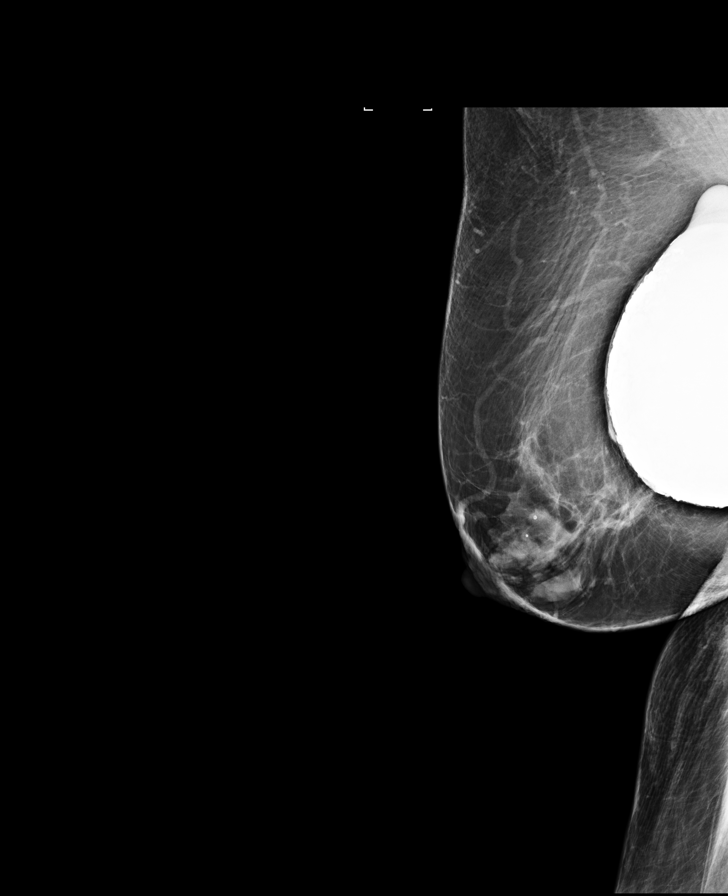

[R CC synth-2D]
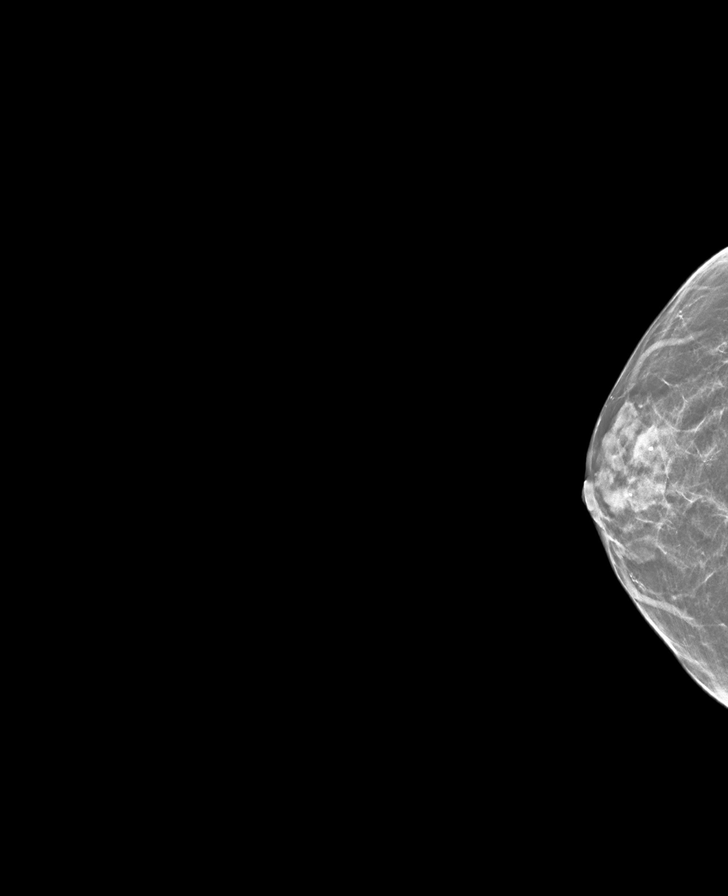

[L CC synth-2D]
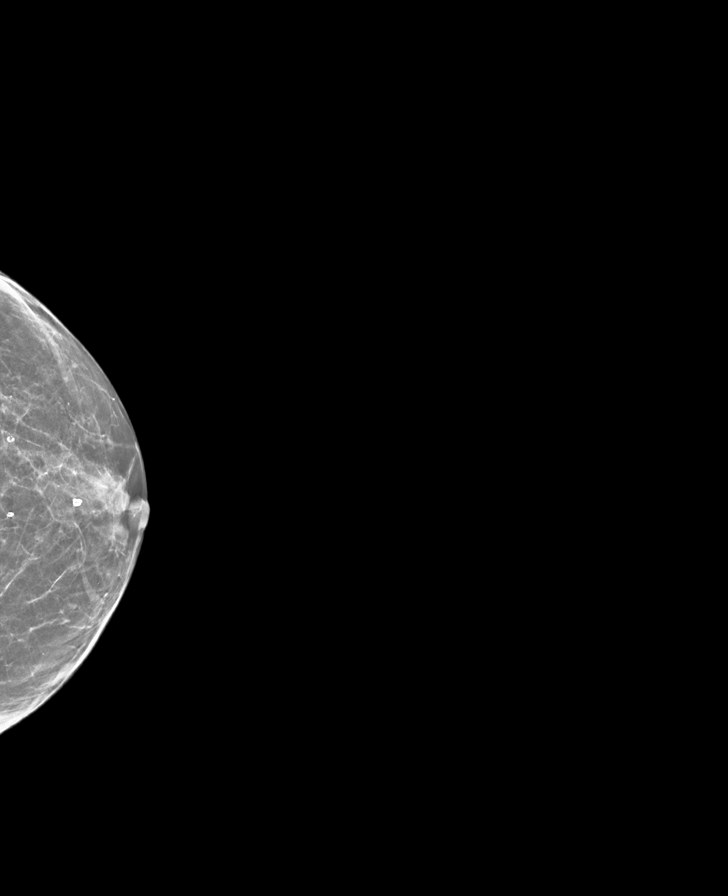

[L MLO synth-2D]
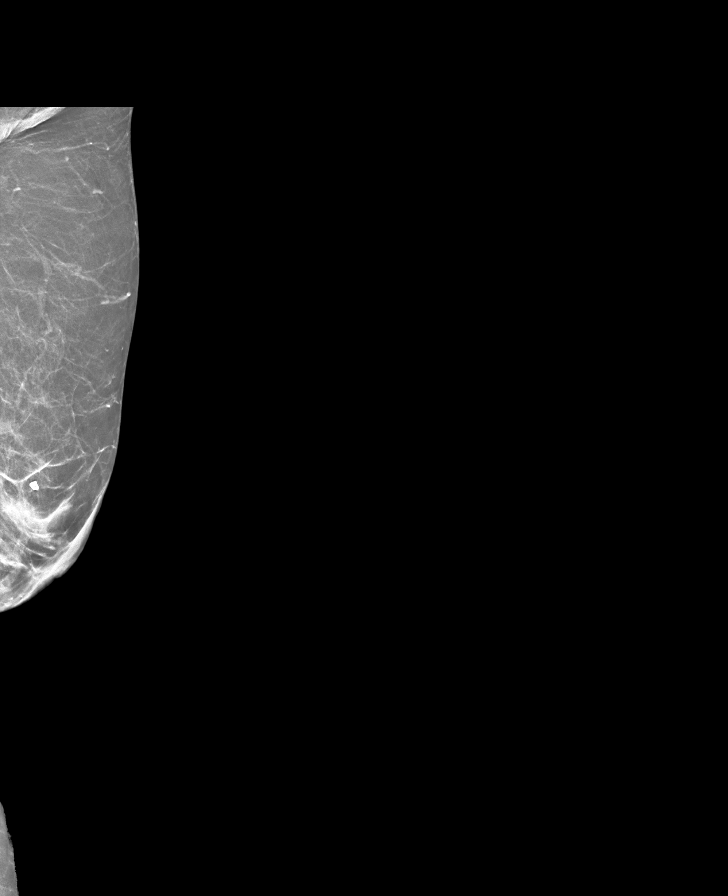

[R MLO synth-2D]
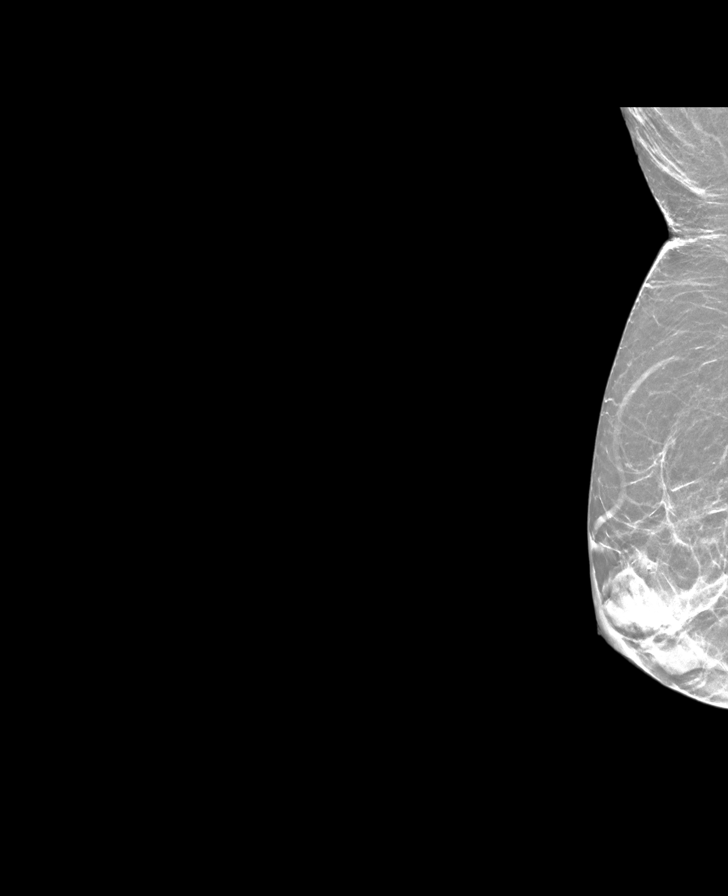

[8 of 28 positions shown; findings below may reference images not displayed]

ACR Breast Density Category b: There are scattered areas of
fibroglandular density.
FINDINGS: There are no findings suspicious for malignancy. Images were
processed with CAD.
IMPRESSION: No mammographic evidence of malignancy. A result letter of this
screening mammogram will be mailed directly to the patient.

RECOMMENDATION:
Screening mammogram in one year. (Code:WE-0-IUH)

BI-RADS CATEGORY  1:  Negative.

## 2020-11-15 IMAGING — CR DG HAND COMPLETE 3+V*L*
3 series · 3 of 3 positions shown · non-contrast
Comparison: None.

CLINICAL DATA: Chronic LEFT hand pain for several years. No known
injury. Initial encounter.

EXAM:
LEFT HAND - COMPLETE 3+ VIEW

[x hand pa left]
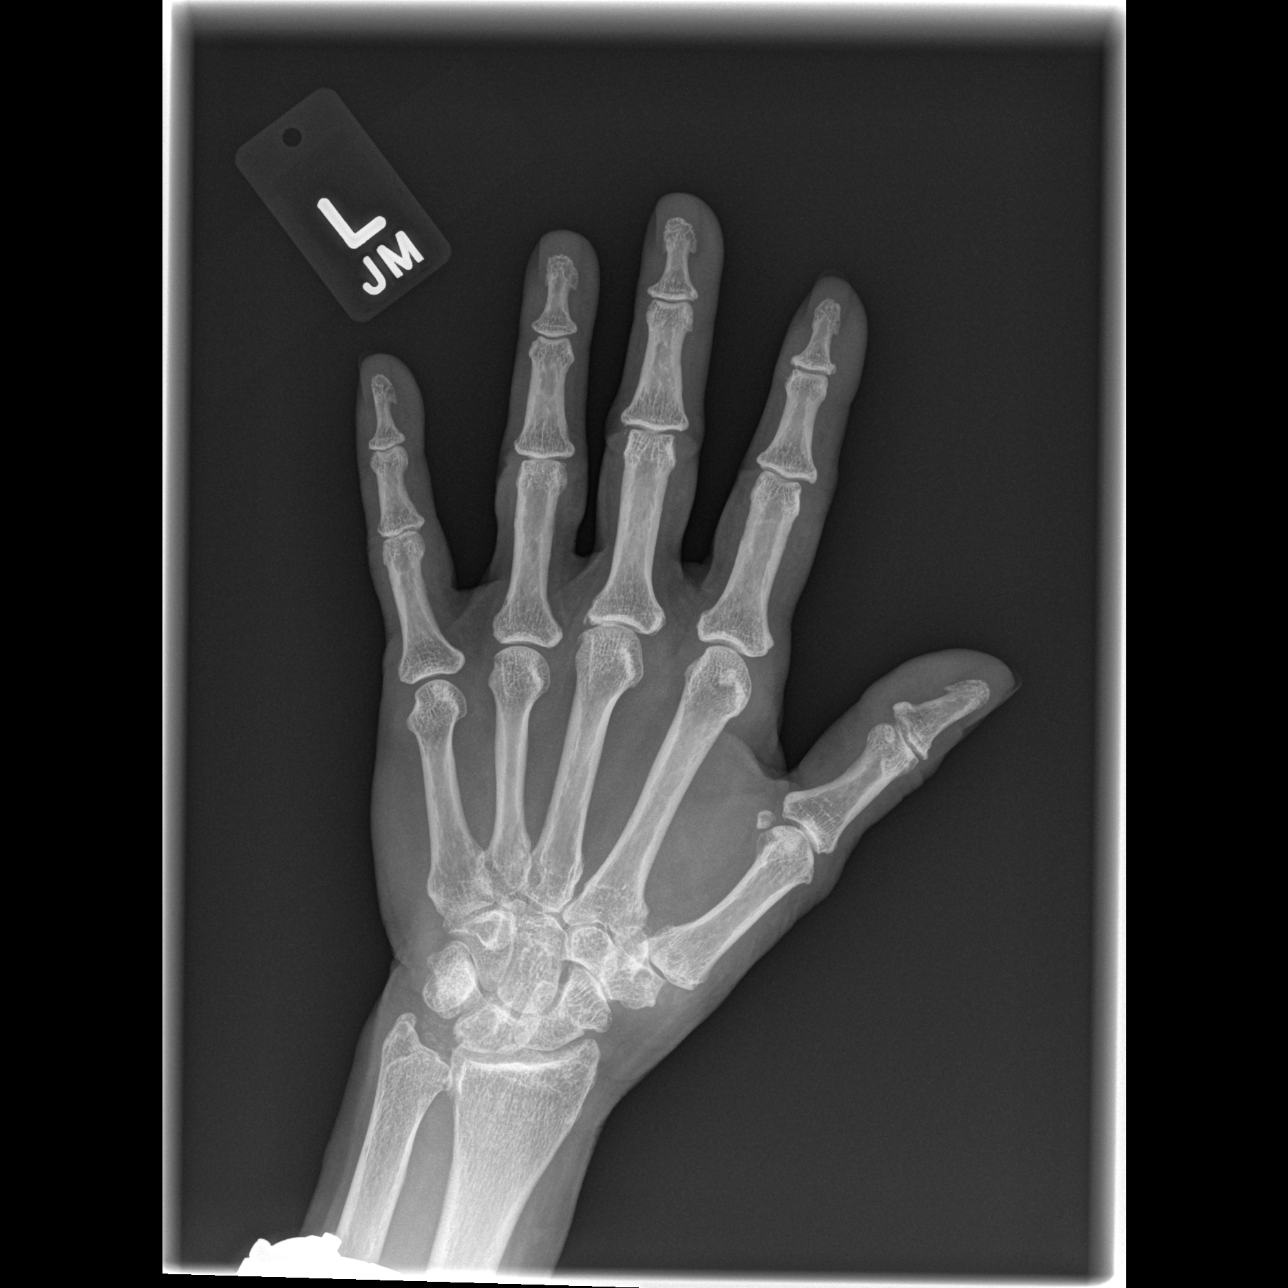

[x hand oblique left]
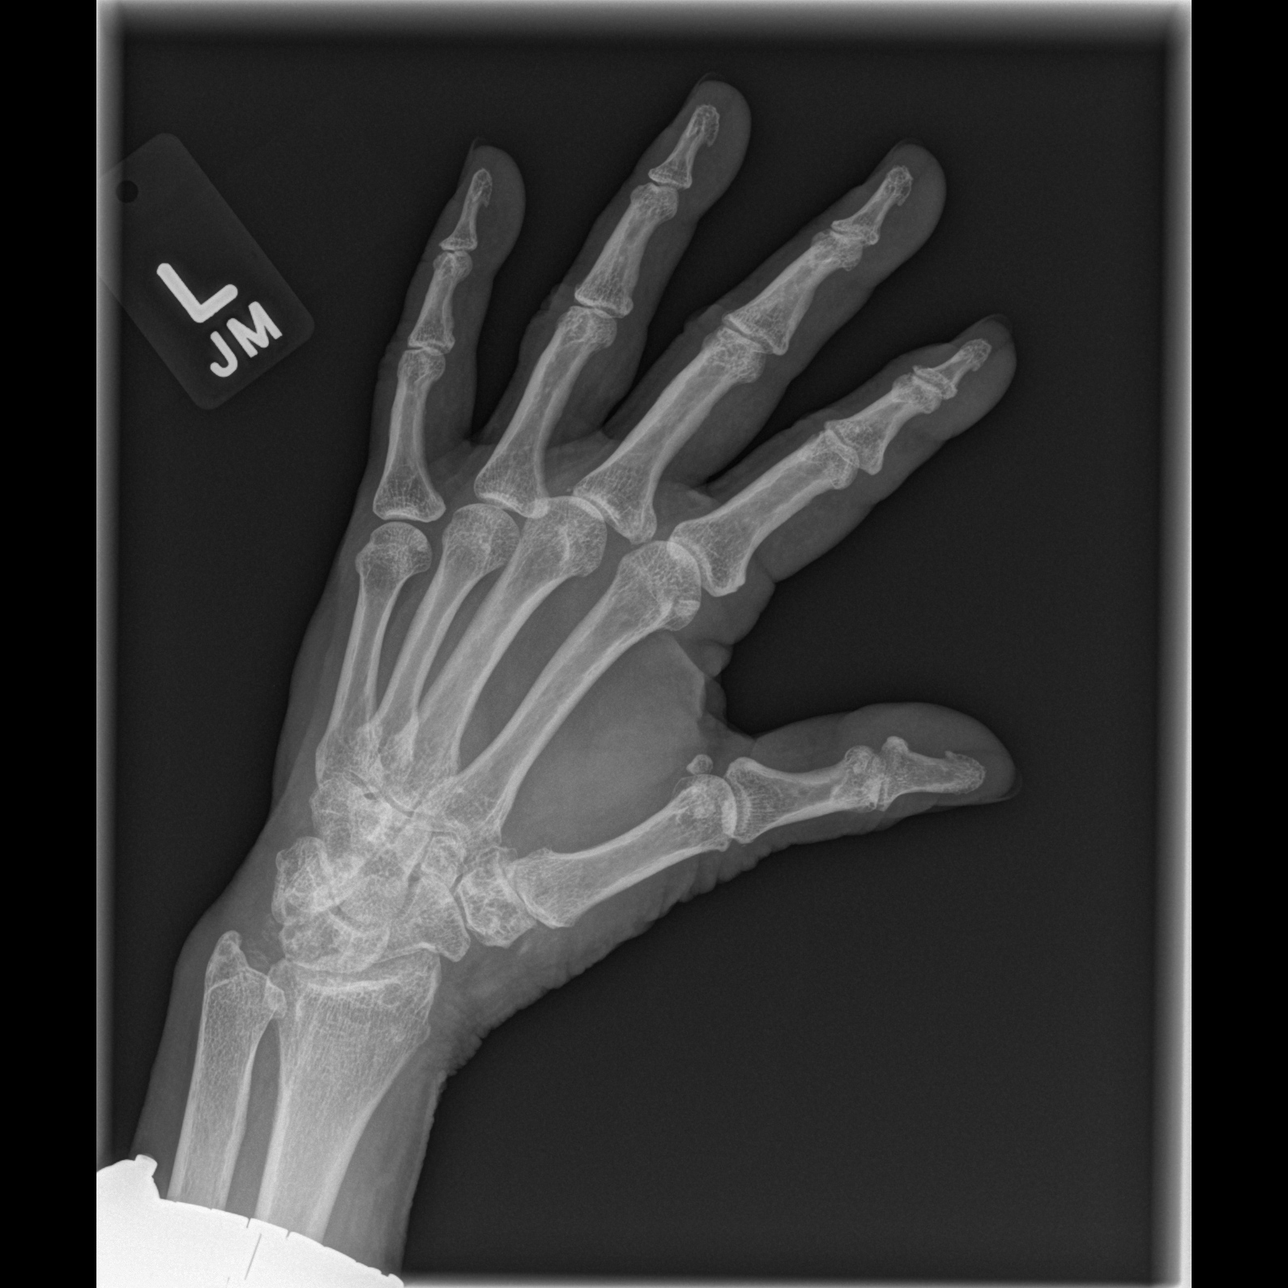

[x hand lat left]
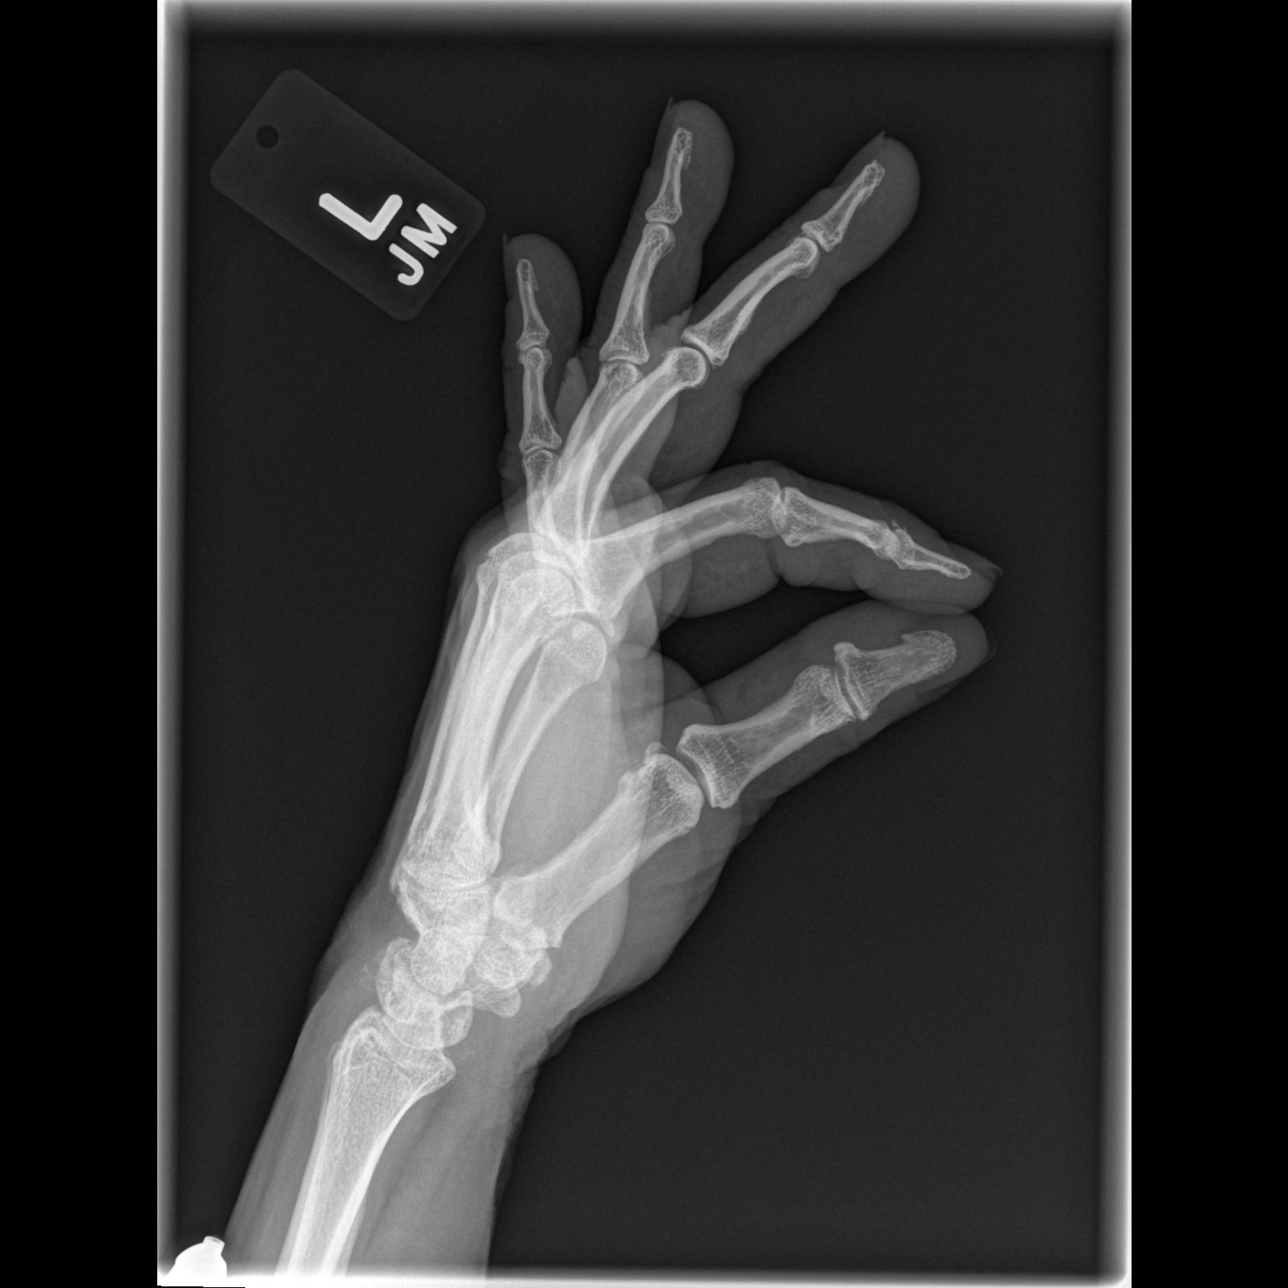

[3 of 3 positions shown; findings below may reference images not displayed]

FINDINGS: No acute fracture or dislocation identified.

Moderate degenerative changes of 1st carpometacarpal joint
identified.

Mild degenerative changes in the PIP joints identified.

Wrist chondrocalcinosis is present.

No erosive changes are noted.
IMPRESSION: 1. No evidence of acute abnormality.
2. Degenerative changes as described.
3. Wrist chondrocalcinosis/CPPD

## 2020-11-22 IMAGING — CR DG CHEST 2V
2 series · 2 of 2 positions shown · non-contrast
Comparison: None.

CLINICAL DATA: Chest pain

EXAM:
CHEST - 2 VIEW

[w chest pa]
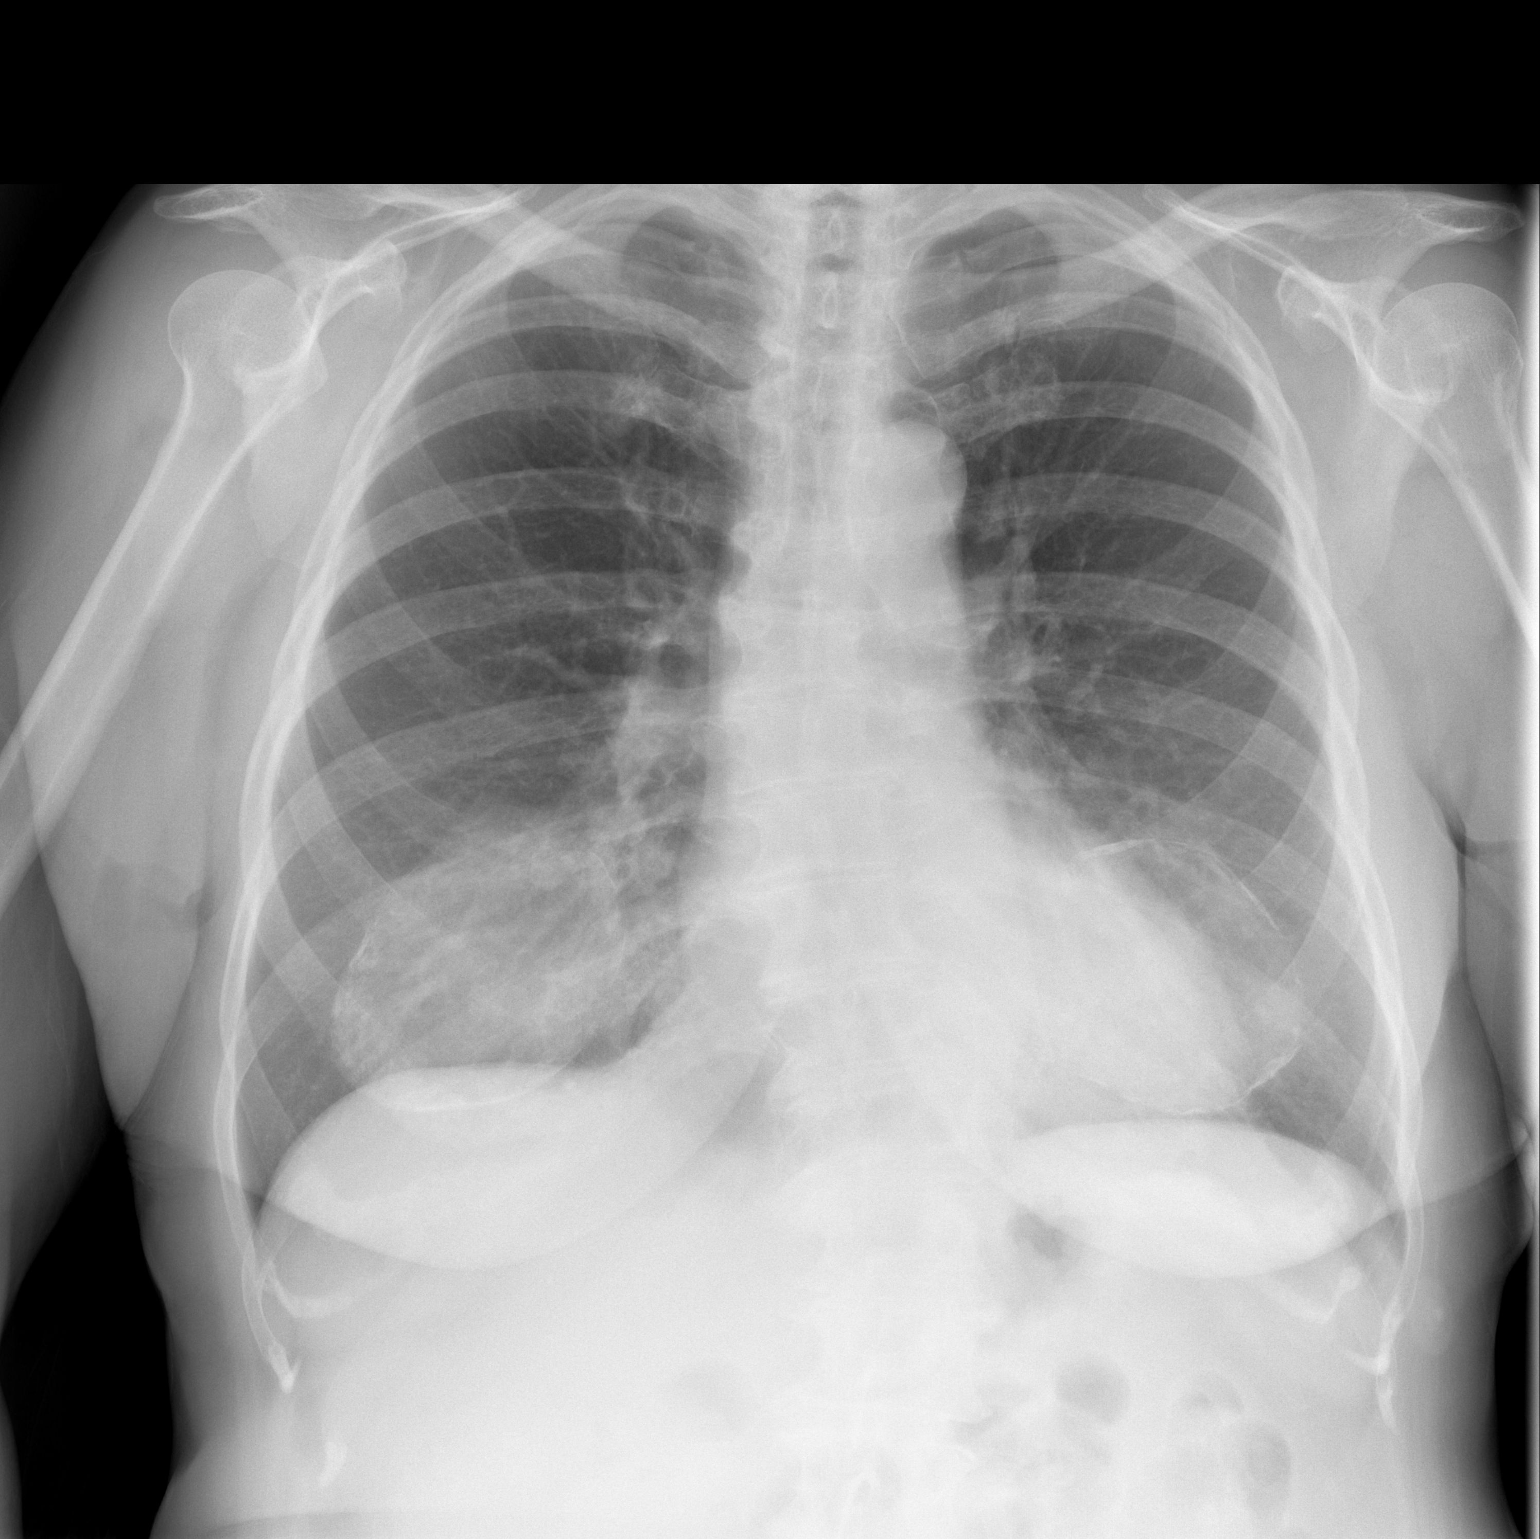

[w chest lat]
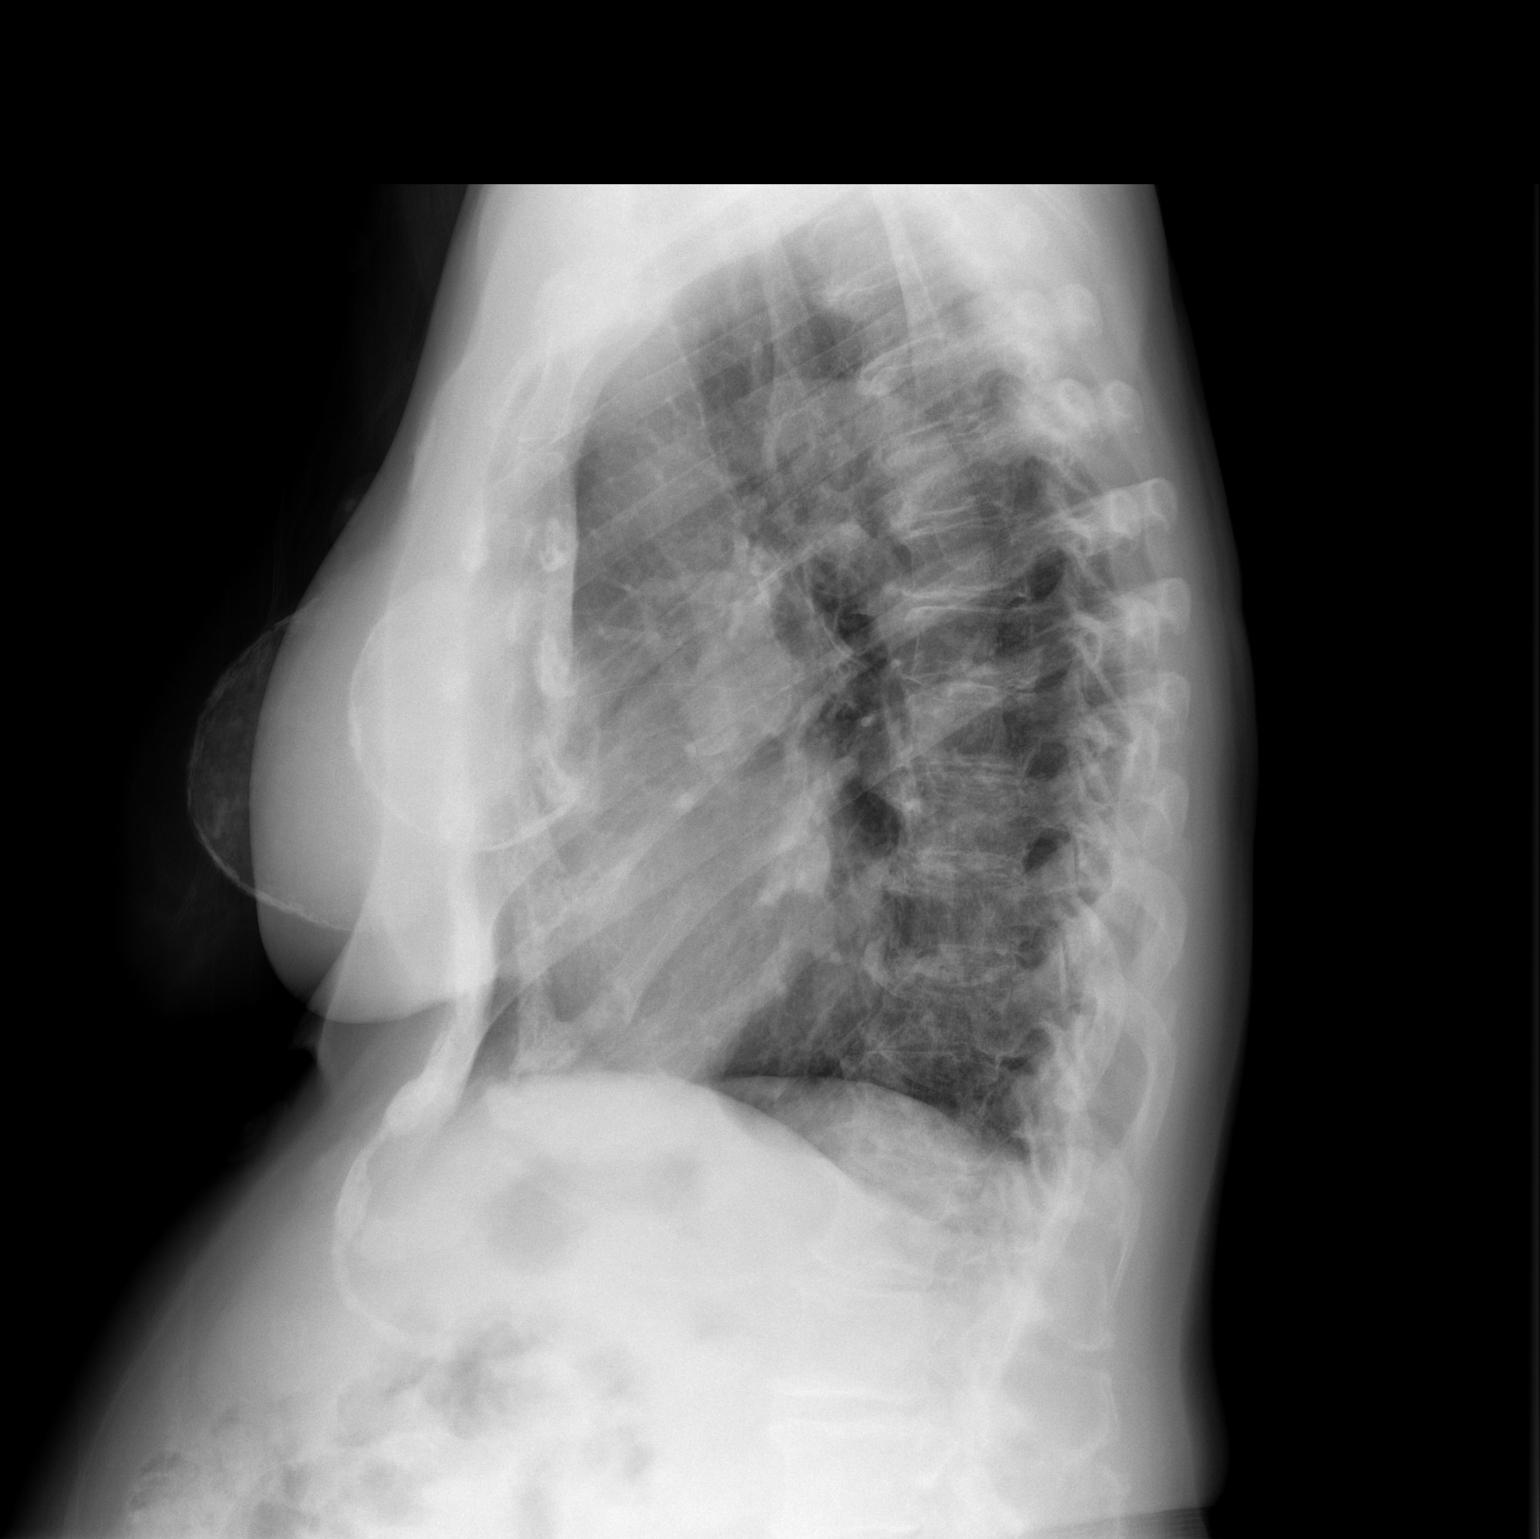

[2 of 2 positions shown; findings below may reference images not displayed]

FINDINGS: The heart size and mediastinal contours are within normal limits.
Both lungs are clear. The visualized skeletal structures are
unremarkable.

Bilateral breast implants.
IMPRESSION: No active cardiopulmonary disease.

## 2021-01-22 DIAGNOSIS — M150 Primary generalized (osteo)arthritis: Secondary | ICD-10-CM | POA: Diagnosis not present

## 2021-01-22 DIAGNOSIS — M79642 Pain in left hand: Secondary | ICD-10-CM | POA: Diagnosis not present

## 2021-01-22 DIAGNOSIS — M79641 Pain in right hand: Secondary | ICD-10-CM | POA: Diagnosis not present

## 2021-01-22 DIAGNOSIS — M79672 Pain in left foot: Secondary | ICD-10-CM | POA: Diagnosis not present

## 2021-01-23 ENCOUNTER — Other Ambulatory Visit: Payer: Self-pay | Admitting: Family Medicine

## 2021-01-23 DIAGNOSIS — I10 Essential (primary) hypertension: Secondary | ICD-10-CM

## 2021-01-26 ENCOUNTER — Ambulatory Visit (INDEPENDENT_AMBULATORY_CARE_PROVIDER_SITE_OTHER): Payer: Medicare Other

## 2021-01-26 VITALS — Ht 65.0 in | Wt 162.0 lb

## 2021-01-26 DIAGNOSIS — Z0000 Encounter for general adult medical examination without abnormal findings: Secondary | ICD-10-CM

## 2021-01-26 NOTE — Progress Notes (Signed)
Subjective:   Jennifer Gonzalez is a 75 y.o. female who presents for an Initial Medicare Annual Wellness Visit.  I connected with Gurbani today by telephone and verified that I am speaking with the correct person using two identifiers. Location patient: home Location provider: work Persons participating in the virtual visit: patient, Engineer, civil (consulting).    I discussed the limitations, risks, security and privacy concerns of performing an evaluation and management service by telephone and the availability of in person appointments. I also discussed with the patient that there may be a patient responsible charge related to this service. The patient expressed understanding and verbally consented to this telephonic visit.    Interactive audio and video telecommunications were attempted between this provider and patient, however failed, due to patient having technical difficulties OR patient did not have access to video capability.  We continued and completed visit with audio only.  Some vital signs may be absent or patient reported.   Time Spent with patient on telephone encounter: 20 minutes   Review of Systems     Cardiac Risk Factors include: advanced age (>53men, >32 women);dyslipidemia;hypertension     Objective:    Today's Vitals   01/26/21 1423  Weight: 162 lb (73.5 kg)  Height: 5\' 5"  (1.651 m)   Body mass index is 26.96 kg/m.  Advanced Directives 01/26/2021 04/23/2019  Does Patient Have a Medical Advance Directive? Yes Yes  Type of 06/21/2019 of [place];Living will Healthcare Power of Attorney  Copy of Healthcare Power of Attorney in Chart? No - copy requested -    Current Medications (verified) Outpatient Encounter Medications as of 01/26/2021  Medication Sig   acetaminophen (TYLENOL) 650 MG CR tablet Take 650 mg by mouth 2 (two) times daily as needed for pain.   Ascorbic Acid (VITAMIN C) 1000 MG tablet Take 1,000 mg by mouth.   chlorpheniramine (CHLOR-TRIMETON) 4  MG tablet Take 1 tablet by mouth 2 (two) times daily.   Cholecalciferol (VITAMIN D3) 2000 units TABS Take 1 tablet by mouth daily.   diclofenac (VOLTAREN) 75 MG EC tablet Take 75 mg by mouth daily.   losartan-hydrochlorothiazide (HYZAAR) 100-25 MG tablet TAKE 1 TABLET BY MOUTH DAILY   Multiple Vitamin (MULTIVITAMIN) capsule Take 1 capsule by mouth daily.   OVER THE COUNTER MEDICATION Take 1,000 mg by mouth 2 (two) times daily. TURMERIC   Probiotic CAPS Take 1 capsule by mouth daily.   OVER THE COUNTER MEDICATION Take 750 mg by mouth daily. Mega Red   No facility-administered encounter medications on file as of 01/26/2021.    Allergies (verified) Amlodipine, Aspirin, Citrullus vulgaris, Clindamycin, Codeine, Diphenhydramine hcl (sleep), Loratadine, Metronidazole, Penicillins, Pineapple, and Tramadol   History: Past Medical History:  Diagnosis Date   Arthritis    Chicken pox    High cholesterol    Hypertension    Seasonal allergies    Past Surgical History:  Procedure Laterality Date   APPENDECTOMY  1969   AUGMENTATION MAMMAPLASTY     CESAREAN SECTION  1969   Family History  Problem Relation Age of Onset   Cancer Mother    Early death Mother    Hypertension Mother    Alcohol abuse Father    Arthritis Father    Asthma Father    COPD Father    Heart disease Father    Hypertension Father    Asthma Daughter    Social History   Socioeconomic History   Marital status: Married    Spouse name: Not  on file   Number of children: Not on file   Years of education: Not on file   Highest education level: Not on file  Occupational History   Not on file  Tobacco Use   Smoking status: Never   Smokeless tobacco: Never  Vaping Use   Vaping Use: Never used  Substance and Sexual Activity   Alcohol use: Yes    Comment: daily   Drug use: No   Sexual activity: Not on file  Other Topics Concern   Not on file  Social History Narrative   Not on file   Social Determinants of  Health   Financial Resource Strain: Low Risk    Difficulty of Paying Living Expenses: Not hard at all  Food Insecurity: No Food Insecurity   Worried About Running Out of Food in the Last Year: Never true   Ran Out of Food in the Last Year: Never true  Transportation Needs: No Transportation Needs   Lack of Transportation (Medical): No   Lack of Transportation (Non-Medical): No  Physical Activity: Inactive   Days of Exercise per Week: 0 days   Minutes of Exercise per Session: 0 min  Stress: No Stress Concern Present   Feeling of Stress : Not at all  Social Connections: Moderately Integrated   Frequency of Communication with Friends and Family: More than three times a week   Frequency of Social Gatherings with Friends and Family: More than three times a week   Attends Religious Services: Never   Database administrator or Organizations: Yes   Attends Engineer, structural: 1 to 4 times per year   Marital Status: Married    Tobacco Counseling Counseling given: Not Answered   Clinical Intake:  Pre-visit preparation completed: Yes  Pain : No/denies pain     BMI - recorded: 26.96 Nutritional Status: BMI 25 -29 Overweight Nutritional Risks: None Diabetes: No  How often do you need to have someone help you when you read instructions, pamphlets, or other written materials from your doctor or pharmacy?: 1 - Never  Diabetic?No  Interpreter Needed?: No  Information entered by :: Thomasenia Sales LPN   Activities of Daily Living In your present state of health, do you have any difficulty performing the following activities: 01/26/2021 01/26/2021  Hearing? N Y  Vision? N N  Difficulty concentrating or making decisions? N N  Walking or climbing stairs? N Y  Dressing or bathing? N N  Doing errands, shopping? N N  Preparing Food and eating ? N N  Using the Toilet? N N  In the past six months, have you accidently leaked urine? N N  Do you have problems with loss of bowel  control? N N  Managing your Medications? N N  Managing your Finances? N N  Housekeeping or managing your Housekeeping? N N  Some recent data might be hidden    Patient Care Team: Copland, Gwenlyn Found, MD as PCP - General (Family Medicine)  Indicate any recent Medical Services you may have received from other than [facility] providers in the past year (date may be approximate).     Assessment:   This is a routine wellness examination for Le.  Hearing/Vision screen Hearing Screening - Comments:: Bilateral hearing aids Vision Screening - Comments:: Wears glasses Last eye exam-06/2020  Dietary issues and exercise activities discussed: Current Exercise Habits: The patient does not participate in regular exercise at present, Exercise limited by: None identified   Goals Addressed  This Visit's Progress    Patient Stated       Would like to improve posture       Depression Screen PHQ 2/9 Scores 01/26/2021 10/20/2020  PHQ - 2 Score 0 0    Fall Risk Fall Risk  01/26/2021 01/26/2021 01/25/2021 10/20/2020  Falls in the past year? 0 0 0 0  Number falls in past yr: 0 0 0 -  Injury with Fall? 0 0 0 -  Follow up Falls prevention discussed - - -    FALL RISK PREVENTION PERTAINING TO THE HOME:  Any stairs in or around the home? Yes  If so, are there any without handrails? No  Home free of loose throw rugs in walkways, pet beds, electrical cords, etc? Yes  Adequate lighting in your home to reduce risk of falls? Yes   ASSISTIVE DEVICES UTILIZED TO PREVENT FALLS:  Life alert? No  Use of a cane, walker or w/c? No  Grab bars in the bathroom? Yes  Shower chair or bench in shower? Yes  Elevated toilet seat or a handicapped toilet? No   TIMED UP AND GO:  Was the test performed? No . Phone visit   Cognitive Function:Normal cognitive status assessed by this Nurse Health Advisor. No abnormalities found.          Immunizations Immunization History  Administered Date(s)  Administered   Influenza Split 03/22/1988, 07/15/2012   Janssen (J&J) SARS-COV-2 Vaccination 06/26/2019   Pneumococcal Conjugate-13 03/26/2016   Pneumococcal Polysaccharide-23 07/15/2012, 04/23/2013    TDAP status: Up to date  Flu Vaccine status: Declined, Education has been provided regarding the importance of this vaccine but patient still declined. Advised may receive this vaccine at local pharmacy or Health Dept. Aware to provide a copy of the vaccination record if obtained from local pharmacy or Health Dept. Verbalized acceptance and understanding.  Pneumococcal vaccine status: Up to date  Covid-19 vaccine status: Declined, Education has been provided regarding the importance of this vaccine but patient still declined. Advised may receive this vaccine at local pharmacy or Health Dept.or vaccine clinic. Aware to provide a copy of the vaccination record if obtained from local pharmacy or Health Dept. Verbalized acceptance and understanding.  Qualifies for Shingles Vaccine? Yes   Zostavax completed No   Shingrix Completed?: No.    Education has been provided regarding the importance of this vaccine. Patient has been advised to call insurance company to determine out of pocket expense if they have not yet received this vaccine. Advised may also receive vaccine at local pharmacy or Health Dept. Verbalized acceptance and understanding.  Screening Tests Health Maintenance  Topic Date Due   Zoster Vaccines- Shingrix (1 of 2) Never done   COVID-19 Vaccine (2 - Booster for Janssen series) 08/21/2019   TETANUS/TDAP  03/23/2023   Pneumonia Vaccine 27+ Years old  Completed   DEXA SCAN  Completed   Hepatitis C Screening  Completed   HPV VACCINES  Aged Out   INFLUENZA VACCINE  Discontinued    Health Maintenance  Health Maintenance Due  Topic Date Due   Zoster Vaccines- Shingrix (1 of 2) Never done   COVID-19 Vaccine (2 - Booster for Janssen series) 08/21/2019    Colorectal cancer  screening: No longer required.   Mammogram status: Completed 10/28/2020-bilateral. Repeat every year  Bone Density status: Declined  Lung Cancer Screening: (Low Dose CT Chest recommended if Age 39-80 years, 30 pack-year currently smoking OR have quit w/in 15years.) does not qualify.  Additional Screening:  Hepatitis C Screening: Completed 03/08/2018  Vision Screening: Recommended annual ophthalmology exams for early detection of glaucoma and other disorders of the eye. Is the patient up to date with their annual eye exam?  Yes  Who is the provider or what is the name of the office in which the patient attends annual eye exams? Pt unsure of name   Dental Screening: Recommended annual dental exams for proper oral hygiene  Community Resource Referral / Chronic Care Management: CRR required this visit?  No   CCM required this visit?  No      Plan:     I have personally reviewed and noted the following in the patient's chart:   Medical and social history Use of alcohol, tobacco or illicit drugs  Current medications and supplements including opioid prescriptions. Patient is not currently taking opioid prescriptions. Functional ability and status Nutritional status Physical activity Advanced directives List of other physicians Hospitalizations, surgeries, and ER visits in previous 12 months Vitals Screenings to include cognitive, depression, and falls Referrals and appointments  In addition, I have reviewed and discussed with patient certain preventive protocols, quality metrics, and best practice recommendations. A written personalized care plan for preventive services as well as general preventive health recommendations were provided to patient.   Due to this being a telephonic visit, the after visit summary with patients personalized plan was offered to patient via mail or my-chart.  Patient would like to access on my-chart.   Roanna Raider, LPN   [id number]  Nurse  Health Advisor  Nurse Notes: None

## 2021-01-26 NOTE — Patient Instructions (Signed)
Ms. Basich , Thank you for taking time to complete your Medicare Wellness Visit. I appreciate your ongoing commitment to your health goals. Please review the following plan we discussed and let me know if I can assist you in the future.   Screening recommendations/referrals: Colonoscopy: No longer required Mammogram: Completed 10/28/2020- Repeat every year Bone Density: Declined Recommended yearly ophthalmology/optometry visit for glaucoma screening and checkup Recommended yearly dental visit for hygiene and checkup  Vaccinations: Influenza vaccine: Declined Pneumococcal vaccine: Up to date Tdap vaccine: Up to date-Due-03/23/2023 Shingles vaccine: Discuss with pharmacy   Covid-19:Declined  Advanced directives: Please bring a copy of Living Will and/or Healthcare Power of Attorney for your chart.   Conditions/risks identified: See problem list  Next appointment: Follow up in one year for your annual wellness visit    Preventive Care 65 Years and Older, Female Preventive care refers to lifestyle choices and visits with your health care provider that can promote health and wellness. What does preventive care include? A yearly physical exam. This is also called an annual well check. Dental exams once or twice a year. Routine eye exams. Ask your health care provider how often you should have your eyes checked. Personal lifestyle choices, including: Daily care of your teeth and gums. Regular physical activity. Eating a healthy diet. Avoiding tobacco and drug use. Limiting alcohol use. Practicing safe sex. Taking low-dose aspirin every day. Taking vitamin and mineral supplements as recommended by your health care provider. What happens during an annual well check? The services and screenings done by your health care provider during your annual well check will depend on your age, overall health, lifestyle risk factors, and family history of disease. Counseling  Your health care  provider may ask you questions about your: Alcohol use. Tobacco use. Drug use. Emotional well-being. Home and relationship well-being. Sexual activity. Eating habits. History of falls. Memory and ability to understand (cognition). Work and work Astronomer. Reproductive health. Screening  You may have the following tests or measurements: Height, weight, and BMI. Blood pressure. Lipid and cholesterol levels. These may be checked every 5 years, or more frequently if you are over 80 years old. Skin check. Lung cancer screening. You may have this screening every year starting at age 66 if you have a 30-pack-year history of smoking and currently smoke or have quit within the past 15 years. Fecal occult blood test (FOBT) of the stool. You may have this test every year starting at age 24. Flexible sigmoidoscopy or colonoscopy. You may have a sigmoidoscopy every 5 years or a colonoscopy every 10 years starting at age 47. Hepatitis C blood test. Hepatitis B blood test. Sexually transmitted disease (STD) testing. Diabetes screening. This is done by checking your blood sugar (glucose) after you have not eaten for a while (fasting). You may have this done every 1-3 years. Bone density scan. This is done to screen for osteoporosis. You may have this done starting at age 77. Mammogram. This may be done every 1-2 years. Talk to your health care provider about how often you should have regular mammograms. Talk with your health care provider about your test results, treatment options, and if necessary, the need for more tests. Vaccines  Your health care provider may recommend certain vaccines, such as: Influenza vaccine. This is recommended every year. Tetanus, diphtheria, and acellular pertussis (Tdap, Td) vaccine. You may need a Td booster every 10 years. Zoster vaccine. You may need this after age 39. Pneumococcal 13-valent conjugate (PCV13) vaccine. One dose  is recommended after age  76. Pneumococcal polysaccharide (PPSV23) vaccine. One dose is recommended after age 7. Talk to your health care provider about which screenings and vaccines you need and how often you need them. This information is not intended to replace advice given to you by your health care provider. Make sure you discuss any questions you have with your health care provider. Document Released: 04/04/2015 Document Revised: 11/26/2015 Document Reviewed: 01/07/2015 Elsevier Interactive Patient Education  2017 [place] Prevention in the Home Falls can cause injuries. They can happen to people of all ages. There are many things you can do to make your home safe and to help prevent falls. What can I do on the outside of my home? Regularly fix the edges of walkways and driveways and fix any cracks. Remove anything that might make you trip as you walk through a door, such as a raised step or threshold. Trim any bushes or trees on the path to your home. Use bright outdoor lighting. Clear any walking paths of anything that might make someone trip, such as rocks or tools. Regularly check to see if handrails are loose or broken. Make sure that both sides of any steps have handrails. Any raised decks and porches should have guardrails on the edges. Have any leaves, snow, or ice cleared regularly. Use sand or salt on walking paths during winter. Clean up any spills in your garage right away. This includes oil or grease spills. What can I do in the bathroom? Use night lights. Install grab bars by the toilet and in the tub and shower. Do not use towel bars as grab bars. Use non-skid mats or decals in the tub or shower. If you need to sit down in the shower, use a plastic, non-slip stool. Keep the floor dry. Clean up any water that spills on the floor as soon as it happens. Remove soap buildup in the tub or shower regularly. Attach bath mats securely with double-sided non-slip rug tape. Do not have throw  rugs and other things on the floor that can make you trip. What can I do in the bedroom? Use night lights. Make sure that you have a light by your bed that is easy to reach. Do not use any sheets or blankets that are too big for your bed. They should not hang down onto the floor. Have a firm chair that has side arms. You can use this for support while you get dressed. Do not have throw rugs and other things on the floor that can make you trip. What can I do in the kitchen? Clean up any spills right away. Avoid walking on wet floors. Keep items that you use a lot in easy-to-reach places. If you need to reach something above you, use a strong step stool that has a grab bar. Keep electrical cords out of the way. Do not use floor polish or wax that makes floors slippery. If you must use wax, use non-skid floor wax. Do not have throw rugs and other things on the floor that can make you trip. What can I do with my stairs? Do not leave any items on the stairs. Make sure that there are handrails on both sides of the stairs and use them. Fix handrails that are broken or loose. Make sure that handrails are as long as the stairways. Check any carpeting to make sure that it is firmly attached to the stairs. Fix any carpet that is loose or worn. Avoid  having throw rugs at the top or bottom of the stairs. If you do have throw rugs, attach them to the floor with carpet tape. Make sure that you have a light switch at the top of the stairs and the bottom of the stairs. If you do not have them, ask someone to add them for you. What else can I do to help prevent falls? Wear shoes that: Do not have high heels. Have rubber bottoms. Are comfortable and fit you well. Are closed at the toe. Do not wear sandals. If you use a stepladder: Make sure that it is fully opened. Do not climb a closed stepladder. Make sure that both sides of the stepladder are locked into place. Ask someone to hold it for you, if  possible. Clearly mark and make sure that you can see: Any grab bars or handrails. First and last steps. Where the edge of each step is. Use tools that help you move around (mobility aids) if they are needed. These include: Canes. Walkers. Scooters. Crutches. Turn on the lights when you go into a dark area. Replace any light bulbs as soon as they burn out. Set up your furniture so you have a clear path. Avoid moving your furniture around. If any of your floors are uneven, fix them. If there are any pets around you, be aware of where they are. Review your medicines with your doctor. Some medicines can make you feel dizzy. This can increase your chance of falling. Ask your doctor what other things that you can do to help prevent falls. This information is not intended to replace advice given to you by your health care provider. Make sure you discuss any questions you have with your health care provider. Document Released: 01/02/2009 Document Revised: 08/14/2015 Document Reviewed: 04/12/2014 Elsevier Interactive Patient Education  2017 [employer].

## 2021-03-12 ENCOUNTER — Encounter: Payer: Self-pay | Admitting: Family Medicine

## 2021-03-12 ENCOUNTER — Other Ambulatory Visit: Payer: Self-pay | Admitting: Family Medicine

## 2021-03-12 DIAGNOSIS — U071 COVID-19: Secondary | ICD-10-CM

## 2021-03-12 MED ORDER — MOLNUPIRAVIR EUA 200MG CAPSULE: 4.0000 | ORAL_CAPSULE | Freq: Two times a day (BID) | ORAL | 0 refills | Status: AC

## 2021-04-02 ENCOUNTER — Encounter: Payer: Self-pay | Admitting: Family Medicine

## 2021-04-02 DIAGNOSIS — I10 Essential (primary) hypertension: Secondary | ICD-10-CM

## 2021-04-02 MED ORDER — LOSARTAN POTASSIUM-HCTZ 100-25 MG PO TABS: 1.0000 | ORAL_TABLET | Freq: Every day | ORAL | 1 refills | Status: DC

## 2021-04-02 MED ORDER — DICLOFENAC SODIUM 75 MG PO TBEC: 75.0000 mg | DELAYED_RELEASE_TABLET | Freq: Every day | ORAL | 1 refills | Status: DC

## 2021-06-12 ENCOUNTER — Encounter: Payer: Self-pay | Admitting: Family Medicine

## 2021-06-12 DIAGNOSIS — G8929 Other chronic pain: Secondary | ICD-10-CM

## 2021-06-19 ENCOUNTER — Encounter: Payer: Self-pay | Admitting: Podiatry

## 2021-06-19 ENCOUNTER — Ambulatory Visit: Payer: Medicare Other | Admitting: Podiatry

## 2021-06-19 ENCOUNTER — Ambulatory Visit (INDEPENDENT_AMBULATORY_CARE_PROVIDER_SITE_OTHER): Payer: Medicare Other

## 2021-06-19 DIAGNOSIS — M7731 Calcaneal spur, right foot: Secondary | ICD-10-CM | POA: Diagnosis not present

## 2021-06-19 DIAGNOSIS — M79671 Pain in right foot: Secondary | ICD-10-CM | POA: Diagnosis not present

## 2021-06-19 DIAGNOSIS — M19079 Primary osteoarthritis, unspecified ankle and foot: Secondary | ICD-10-CM | POA: Diagnosis not present

## 2021-06-19 DIAGNOSIS — M79672 Pain in left foot: Secondary | ICD-10-CM | POA: Diagnosis not present

## 2021-06-19 DIAGNOSIS — M19072 Primary osteoarthritis, left ankle and foot: Secondary | ICD-10-CM | POA: Diagnosis not present

## 2021-06-19 DIAGNOSIS — M19071 Primary osteoarthritis, right ankle and foot: Secondary | ICD-10-CM | POA: Diagnosis not present

## 2021-06-19 MED ORDER — MELOXICAM 15 MG PO TABS: 15.0000 mg | ORAL_TABLET | Freq: Every day | ORAL | 0 refills | Status: DC

## 2021-06-19 NOTE — Progress Notes (Signed)
?  Subjective:  ?Patient ID: Jennifer Gonzalez, female    DOB: 1945/11/21,   MRN: [id number] ? ?Chief Complaint  ?Patient presents with  ? Foot Pain  ?   Chronic foot pain  ? ? ?76 y.o. female presents for concern of chronic bilateral foot pain. She relates most of the pain is on the top of her foot. It is not constant but can be sharp and shooting pain. Relates it affects her gait. Has tried some OTC medications with some relief. She is not a diabetic . Denies any other pedal complaints. Denies n/v/f/c.  ? ?Past Medical History:  ?Diagnosis Date  ? Arthritis   ? Chicken pox   ? High cholesterol   ? Hypertension   ? Seasonal allergies   ? ? ?Objective:  ?Physical Exam: ?Vascular: DP/PT pulses 2/4 bilateral. CFT <3 seconds. Normal hair growth on digits. No edema.  ?Skin. No lacerations or abrasions bilateral feet.  ?Musculoskeletal: MMT 5/5 bilateral lower extremities in DF, PF, Inversion and Eversion. Deceased ROM in DF of ankle joint. Tender over dorsum of foot across all TMTJ. Minimal ROM of the rays and minimal pain.  ?Neurological: Sensation intact to light touch.  ? ?Assessment:  ? ?1. Arthritis of midfoot   ? ? ? ?Plan:  ?Patient was evaluated and treated and all questions answered. ?X-rays reviewed and discussed with patient. No acute fractures or dislocations noted.  Significant degenerative changes noted throughout midfoot bilateral. Spurring noted to posterior heel bilateral. Varus deformity noted to lesser digits.  ?Discussed midfoot arthritis with patient and treatment options.  ?Discussed NSAIDS, topicals, and possible injections.  ?Prescription for meloxicam provided. ?Discussed stiff soled shoes and carbon fiber foot plate.  ?Discussed if pain does not improve can discuss surgical options.  ?Patient to follow-up as needed.  ? ? ? ?Lorenda Peck, DPM  ? ? ?

## 2021-07-22 DIAGNOSIS — H2513 Age-related nuclear cataract, bilateral: Secondary | ICD-10-CM | POA: Diagnosis not present

## 2021-07-22 DIAGNOSIS — H524 Presbyopia: Secondary | ICD-10-CM | POA: Diagnosis not present

## 2021-07-22 DIAGNOSIS — H5211 Myopia, right eye: Secondary | ICD-10-CM | POA: Diagnosis not present

## 2021-07-22 DIAGNOSIS — H25013 Cortical age-related cataract, bilateral: Secondary | ICD-10-CM | POA: Diagnosis not present

## 2021-09-14 ENCOUNTER — Telehealth: Payer: Self-pay | Admitting: Family Medicine

## 2021-09-14 DIAGNOSIS — M159 Polyosteoarthritis, unspecified: Secondary | ICD-10-CM

## 2021-09-15 ENCOUNTER — Other Ambulatory Visit: Payer: Self-pay

## 2021-09-15 DIAGNOSIS — I10 Essential (primary) hypertension: Secondary | ICD-10-CM

## 2021-09-15 MED ORDER — DICLOFENAC SODIUM 75 MG PO TBEC: 75.0000 mg | DELAYED_RELEASE_TABLET | Freq: Two times a day (BID) | ORAL | 1 refills | Status: DC

## 2021-09-15 MED ORDER — LOSARTAN POTASSIUM-HCTZ 100-25 MG PO TABS: 1.0000 | ORAL_TABLET | Freq: Every day | ORAL | 0 refills | Status: DC

## 2021-09-21 NOTE — Progress Notes (Unsigned)
[facility] [address] [place], [place] [address] [phone]  Date:  09/28/2021   Name:  Jennifer Gonzalez   DOB:  1946-03-08   MRN:  [id number]  PCP:  Pearline Cables, MD    Chief Complaint: No chief complaint on file.   History of Present Illness:  Jennifer Gonzalez is a 76 y.o. very pleasant female patient who presents with the following:  Pt seen today for follow-up and refills Last seen myself about a year ago - history of arthritis, hypertension, hyperlipidemia  Shingrix Labs over a year ago- update today She has had mild hypercalcemia previously-normal on most recent labs, PTH normal  Voltaren 75 twice daily as needed Losartan/HCTZ 100/25    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    Past Medical History:  Diagnosis Date   Arthritis    Chicken pox    High cholesterol    Hypertension    Seasonal allergies     Past Surgical History:  Procedure Laterality Date   APPENDECTOMY  1969   AUGMENTATION MAMMAPLASTY     CESAREAN SECTION  1969    Social History   Tobacco Use   Smoking status: Never   Smokeless tobacco: Never  Vaping Use   Vaping Use: Never used  Substance Use Topics   Alcohol use: Yes    Comment: daily   Drug use: No    Family History  Problem Relation Age of Onset   Cancer Mother    Early death Mother    Hypertension Mother    Alcohol abuse Father    Arthritis Father    Asthma Father    COPD Father    Heart disease Father    Hypertension Father    Asthma Daughter     Allergies  Allergen Reactions   Amlodipine     Dizziness    Aspirin Tinitus   Citrullus Vulgaris Itching   Clindamycin Other (See Comments)    Esophageal inflammation     Codeine Other (See Comments)    Hallucinations     Diphenhydramine Hcl (Sleep)  Other (See Comments)    unknown unknown    Loratadine Other (See Comments)    unknown    Metronidazole Itching   Penicillins Itching   Pineapple Itching   Tramadol Other (See Comments)    woozie feeling     Medication list has been reviewed and updated.  Current Outpatient Medications on File Prior to Visit  Medication Sig Dispense Refill   acetaminophen (TYLENOL) 650 MG CR tablet Take 650 mg by mouth 2 (two) times daily as needed for pain.     Ascorbic Acid (VITAMIN C) 1000 MG tablet Take 1,000 mg by mouth.     chlorpheniramine (CHLOR-TRIMETON) 4 MG tablet Take 1 tablet by mouth 2 (two) times daily.     Cholecalciferol (VITAMIN D3) 2000 units TABS Take 1 tablet by mouth daily.     diclofenac (VOLTAREN) 75 MG EC tablet Take 1 tablet (75 mg total) by mouth 2 (two) times daily. Use as needed for joint pain 180 tablet 1   losartan-hydrochlorothiazide (HYZAAR) 100-25 MG tablet Take 1 tablet by mouth daily. 30 tablet 0   Multiple Vitamin (MULTIVITAMIN) capsule Take 1 capsule by mouth daily.     OVER THE COUNTER MEDICATION Take 1,000 mg by mouth 2 (  two) times daily. TURMERIC     Probiotic CAPS Take 1 capsule by mouth daily.     No current facility-administered medications on file prior to visit.    Review of Systems:  As per HPI- otherwise negative.   Physical Examination: There were no vitals filed for this visit. There were no vitals filed for this visit. There is no height or weight on file to calculate BMI. Ideal Body Weight:    GEN: no acute distress. HEENT: Atraumatic, Normocephalic.  Ears and Nose: No external deformity. CV: RRR, No M/G/R. No JVD. No thrill. No extra heart sounds. PULM: CTA B, no wheezes, crackles, rhonchi. No retractions. No resp. distress. No accessory muscle use. ABD: S, NT, ND, +BS. No rebound. No HSM. EXTR: No c/c/e PSYCH: Normally interactive. Conversant.    Assessment and Plan: ***  Signed Abbe Amsterdam, MD

## 2021-09-21 NOTE — Patient Instructions (Incomplete)
Good to see you again today -I will be in touch with your labs ASAP  Consider getting the shingles vaccine at your pharmacy and a covid 19 booster as well  Continue current BP medication

## 2021-09-27 ENCOUNTER — Other Ambulatory Visit: Payer: Self-pay | Admitting: Family Medicine

## 2021-09-27 DIAGNOSIS — I10 Essential (primary) hypertension: Secondary | ICD-10-CM

## 2021-09-28 ENCOUNTER — Encounter: Payer: Self-pay | Admitting: Family Medicine

## 2021-09-28 ENCOUNTER — Ambulatory Visit: Payer: Medicare Other | Admitting: Family Medicine

## 2021-09-28 VITALS — BP 132/76 | HR 104 | Temp 98.1°F | Resp 18 | Ht 65.0 in | Wt 160.0 lb

## 2021-09-28 DIAGNOSIS — M159 Polyosteoarthritis, unspecified: Secondary | ICD-10-CM | POA: Diagnosis not present

## 2021-09-28 DIAGNOSIS — Z1329 Encounter for screening for other suspected endocrine disorder: Secondary | ICD-10-CM | POA: Diagnosis not present

## 2021-09-28 DIAGNOSIS — I10 Essential (primary) hypertension: Secondary | ICD-10-CM | POA: Diagnosis not present

## 2021-09-28 DIAGNOSIS — Z131 Encounter for screening for diabetes mellitus: Secondary | ICD-10-CM | POA: Diagnosis not present

## 2021-09-28 DIAGNOSIS — H02729 Madarosis of unspecified eye, unspecified eyelid and periocular area: Secondary | ICD-10-CM

## 2021-09-28 DIAGNOSIS — E2839 Other primary ovarian failure: Secondary | ICD-10-CM

## 2021-09-28 DIAGNOSIS — E782 Mixed hyperlipidemia: Secondary | ICD-10-CM | POA: Diagnosis not present

## 2021-09-28 MED ORDER — LOSARTAN POTASSIUM-HCTZ 100-25 MG PO TABS: 1.0000 | ORAL_TABLET | Freq: Every day | ORAL | 3 refills | Status: DC

## 2021-09-28 MED ORDER — BIMATOPROST 0.03 % EX SOLN: CUTANEOUS | 12 refills | Status: DC

## 2021-09-29 ENCOUNTER — Encounter: Payer: Self-pay | Admitting: Family Medicine

## 2021-09-29 DIAGNOSIS — D7589 Other specified diseases of blood and blood-forming organs: Secondary | ICD-10-CM

## 2021-09-29 LAB — TSH: TSH: 2.13 u[IU]/mL (ref 0.35–5.50)

## 2021-09-29 LAB — CBC
HCT: 36.5 % (ref 36.0–46.0)
Hemoglobin: 12.2 g/dL (ref 12.0–15.0)
MCHC: 33.3 g/dL (ref 30.0–36.0)
MCV: 103.1 fl — ABNORMAL HIGH (ref 78.0–100.0)
Platelets: 226 10*3/uL (ref 150.0–400.0)
RBC: 3.54 Mil/uL — ABNORMAL LOW (ref 3.87–5.11)
RDW: 13.3 % (ref 11.5–15.5)
WBC: 5 10*3/uL (ref 4.0–10.5)

## 2021-09-29 LAB — COMPREHENSIVE METABOLIC PANEL
ALT: 26 U/L (ref 0–35)
AST: 29 U/L (ref 0–37)
Albumin: 4.8 g/dL (ref 3.5–5.2)
Alkaline Phosphatase: 60 U/L (ref 39–117)
BUN: 27 mg/dL — ABNORMAL HIGH (ref 6–23)
CO2: 29 mEq/L (ref 19–32)
Calcium: 10 mg/dL (ref 8.4–10.5)
Chloride: 100 mEq/L (ref 96–112)
Creatinine, Ser: 0.96 mg/dL (ref 0.40–1.20)
GFR: 57.35 mL/min — ABNORMAL LOW (ref >60.00)
Glucose, Bld: 112 mg/dL — ABNORMAL HIGH (ref 70–99)
Potassium: 4.5 mEq/L (ref 3.5–5.1)
Sodium: 139 mEq/L (ref 135–145)
Total Bilirubin: 0.5 mg/dL (ref 0.2–1.2)
Total Protein: 6.8 g/dL (ref 6.0–8.3)

## 2021-09-29 LAB — LIPID PANEL
Cholesterol: 281 mg/dL — ABNORMAL HIGH (ref 0–200)
HDL: 83.8 mg/dL (ref >39.00)
NonHDL: 197.62
Total CHOL/HDL Ratio: 3
Triglycerides: 218 mg/dL — ABNORMAL HIGH (ref 0.0–149.0)
VLDL: 43.6 mg/dL — ABNORMAL HIGH (ref 0.0–40.0)

## 2021-09-29 LAB — LDL CHOLESTEROL, DIRECT: Direct LDL: 157 mg/dL

## 2021-09-29 LAB — HEMOGLOBIN A1C: Hgb A1c MFr Bld: 5.2 % (ref 4.6–6.5)

## 2021-10-07 ENCOUNTER — Telehealth (HOSPITAL_BASED_OUTPATIENT_CLINIC_OR_DEPARTMENT_OTHER): Payer: Self-pay

## 2021-11-03 ENCOUNTER — Encounter: Payer: Self-pay | Admitting: Family Medicine

## 2021-11-03 DIAGNOSIS — D7589 Other specified diseases of blood and blood-forming organs: Secondary | ICD-10-CM

## 2021-11-03 IMAGING — DX DG CERVICAL SPINE COMPLETE 4+V
5 series · 5 of 5 positions shown · non-contrast
Comparison: None.

CLINICAL DATA: Cervicalgia with upper extremity tingling

EXAM:
CERVICAL SPINE - COMPLETE 4+ VIEW

[c-spine lat]
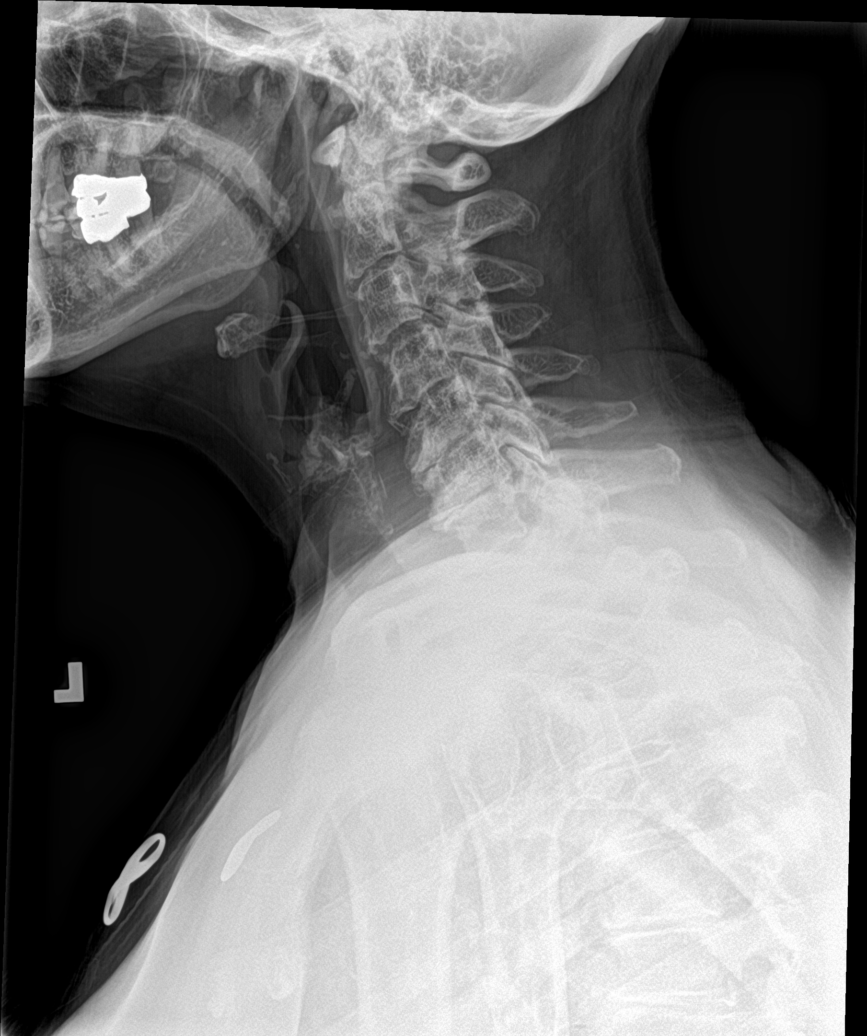

[c-spine obl (1 of 2)]
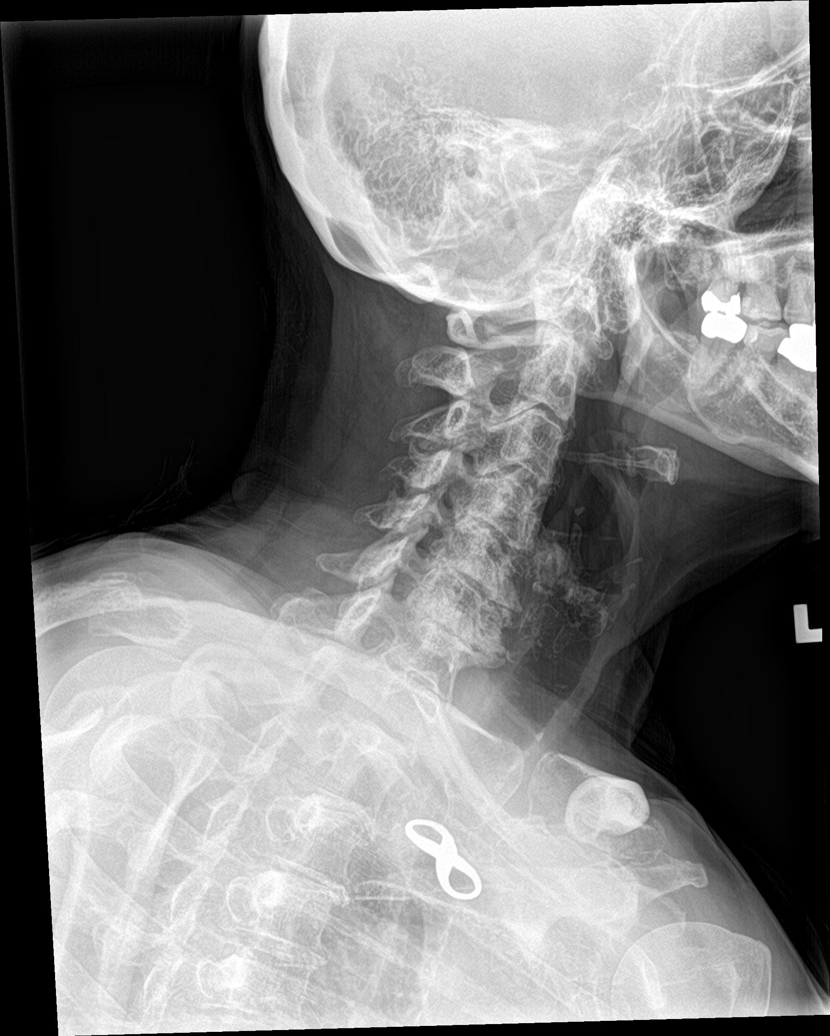

[c-spine obl (2 of 2)]
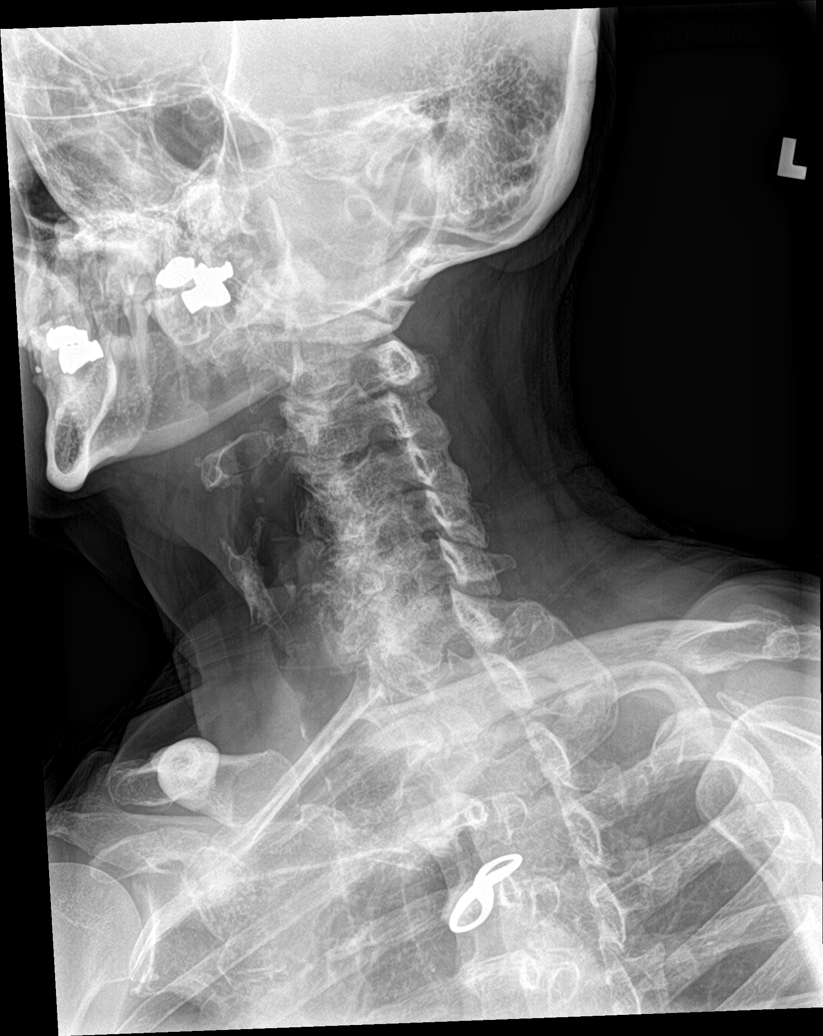

[c-spine ap]
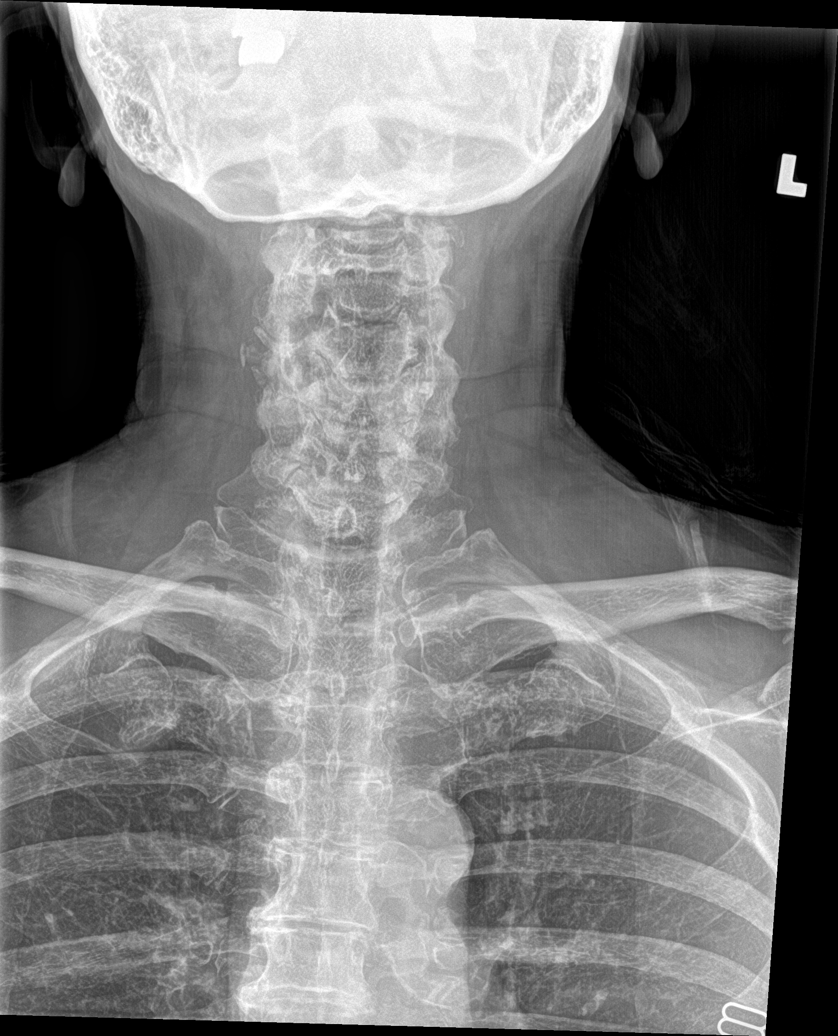

[c-spine open mouth]
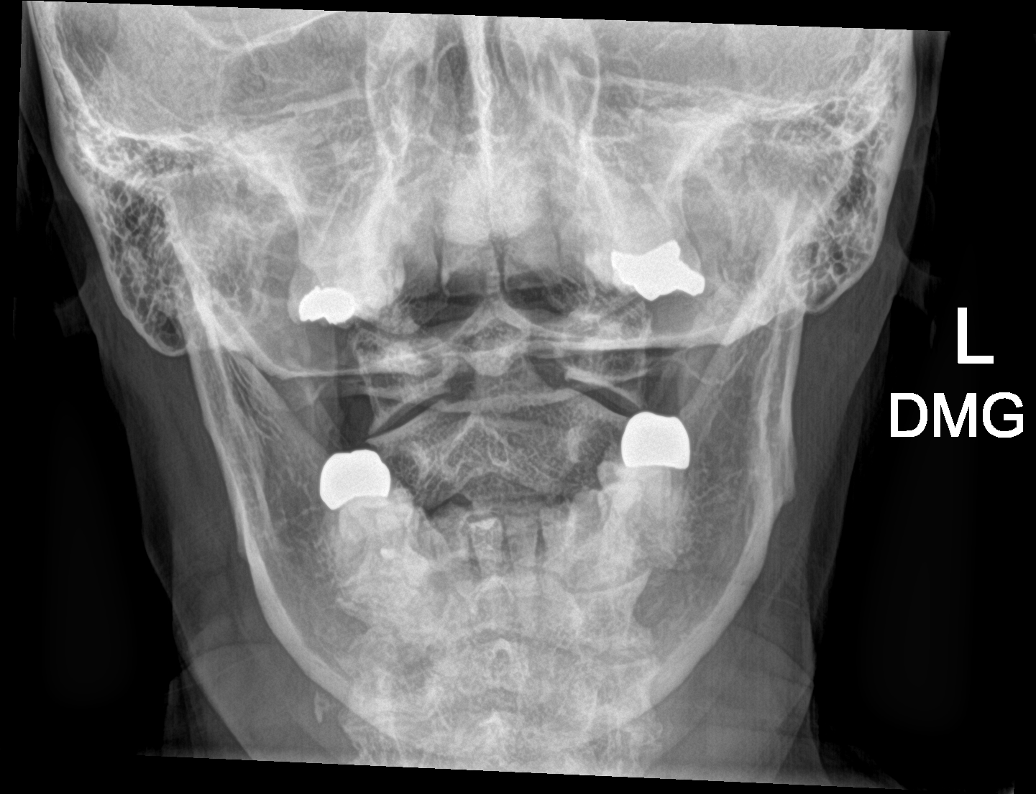

[5 of 5 positions shown; findings below may reference images not displayed]

FINDINGS: Frontal, lateral, open-mouth odontoid, and bilateral oblique views
were obtained. There is no fracture or spondylolisthesis.
Prevertebral soft tissues and predental space regions are normal.
There is severe disc space narrowing at C5-6 and C6-7 with moderate
disc space narrowing at C4-5. Mild disc space narrowing noted at
other levels. There is facet hypertrophy with exit foraminal
narrowing at C4-5 on the left, at C5-6 bilaterally, and at C6-7 on
the left. Lung apices are clear. There is calcification in the right
carotid artery.
IMPRESSION: Multilevel arthropathy. No fracture or spondylolisthesis. Right
carotid artery calcification.

## 2021-11-04 NOTE — Addendum Note (Signed)
Addended by: Abbe Amsterdam C on: 11/04/2021 08:21 AM   Modules accepted: Orders

## 2021-11-05 ENCOUNTER — Other Ambulatory Visit (INDEPENDENT_AMBULATORY_CARE_PROVIDER_SITE_OTHER): Payer: Medicare Other

## 2021-11-05 DIAGNOSIS — D7589 Other specified diseases of blood and blood-forming organs: Secondary | ICD-10-CM

## 2021-11-05 LAB — CBC
HCT: 38.5 % (ref 36.0–46.0)
Hemoglobin: 12.8 g/dL (ref 12.0–15.0)
MCHC: 33.4 g/dL (ref 30.0–36.0)
MCV: 103.2 fl — ABNORMAL HIGH (ref 78.0–100.0)
Platelets: 240 10*3/uL (ref 150.0–400.0)
RBC: 3.73 Mil/uL — ABNORMAL LOW (ref 3.87–5.11)
RDW: 13.1 % (ref 11.5–15.5)
WBC: 4.6 10*3/uL (ref 4.0–10.5)

## 2021-11-05 LAB — VITAMIN B12: Vitamin B-12: 148 pg/mL — ABNORMAL LOW (ref 211–911)

## 2021-11-05 LAB — FOLATE: Folate: 17.6 ng/mL (ref >5.9)

## 2021-11-06 ENCOUNTER — Encounter: Payer: Self-pay | Admitting: Family Medicine

## 2021-11-17 IMAGING — MR MR CERVICAL SPINE W/O CM
4 of 5 series · 26 of 48 positions shown · non-contrast
Comparison: Cervical spine radiographs 04/03/2020.

CLINICAL DATA: Numbness and tingling in right hand. Cervical
radiculopathy, no red flags; patient has noted numbness in her
hands, especially right, now in left as well, neck pain, positive
plain films.

EXAM:
MRI CERVICAL SPINE WITHOUT CONTRAST
TECHNIQUE: Multiplanar, multisequence MR imaging of the cervical spine was
performed. No intravenous contrast was administered.

[Series 5: T2 · sagittal · 3.0mm · 0.55mm/px · 6 of 15 slices shown (1 of 2)]
[im 1/15]
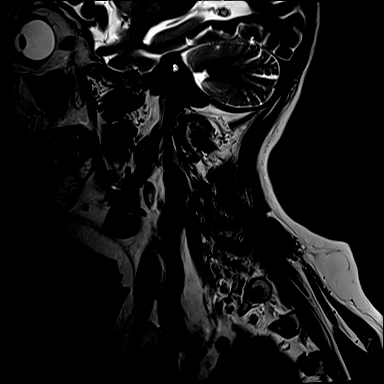
[im 3/15]
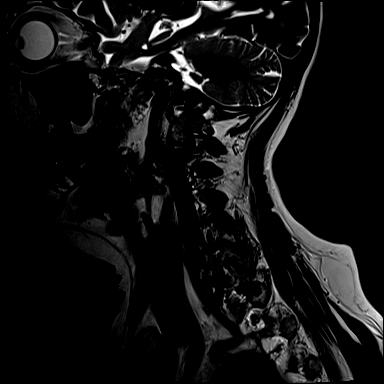
[im 6/15]
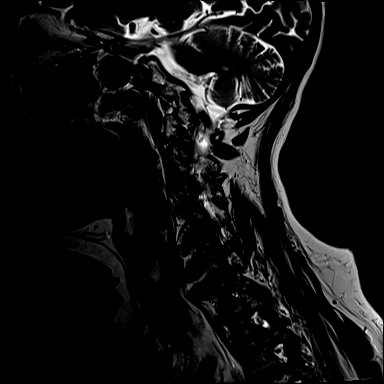
[im 9/15]
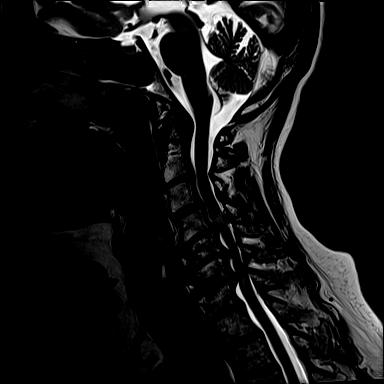
[im 12/15]
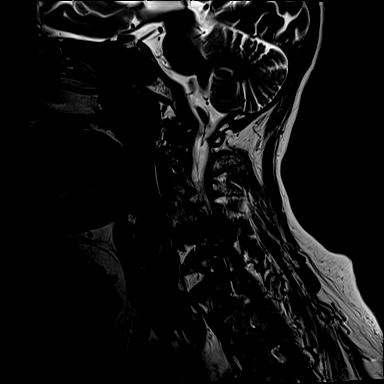
[im 15/15]
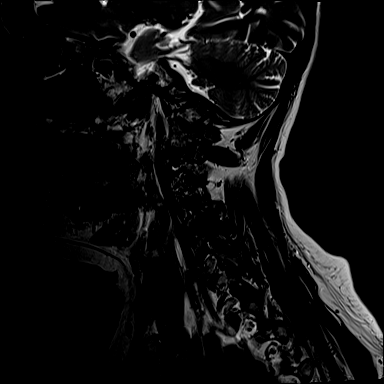

[Series 6: T1 · sagittal · 3.0mm · 0.66mm/px · 7 of 15 slices shown]
[im 1/15]
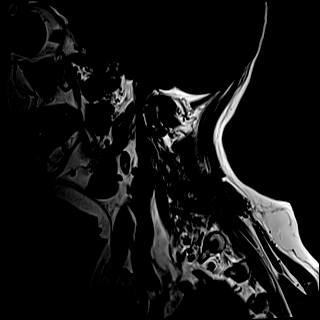
[im 3/15]
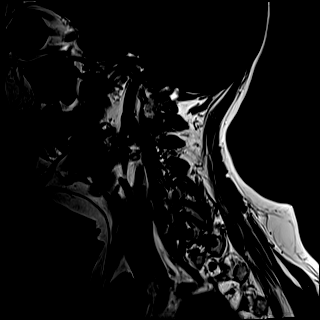
[im 5/15]
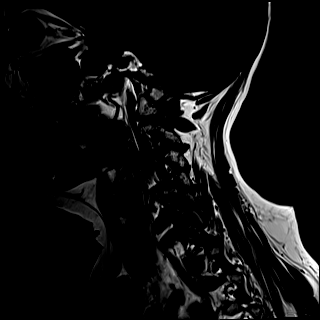
[im 8/15]
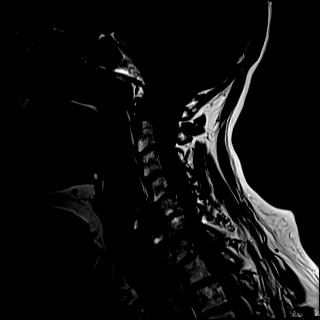
[im 10/15]
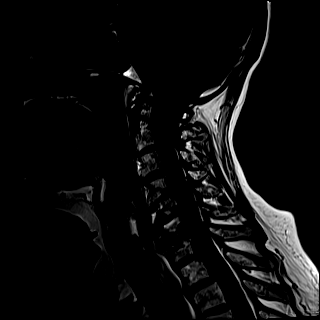
[im 12/15]
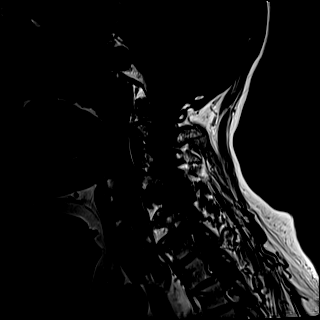
[im 15/15]
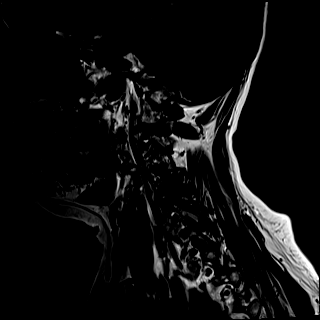

[Series 7: STIR · sagittal · 3.0mm · 0.33mm/px · 5 of 15 slices shown]
[im 1/15]
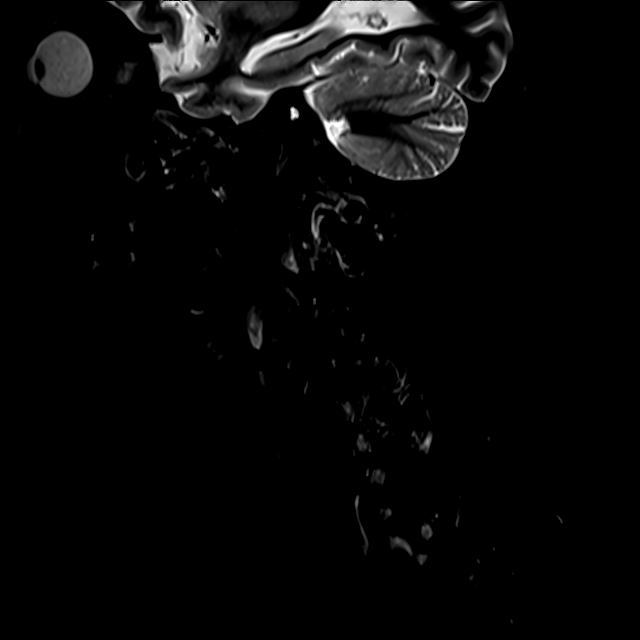
[im 3/15]
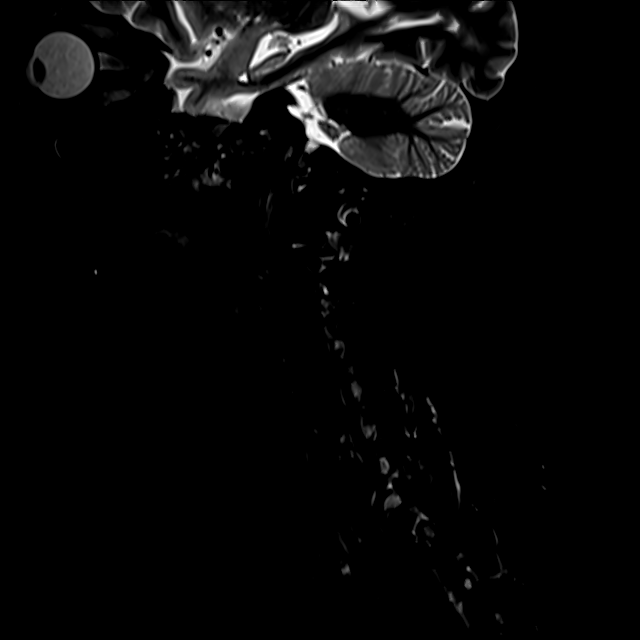
[im 5/15]
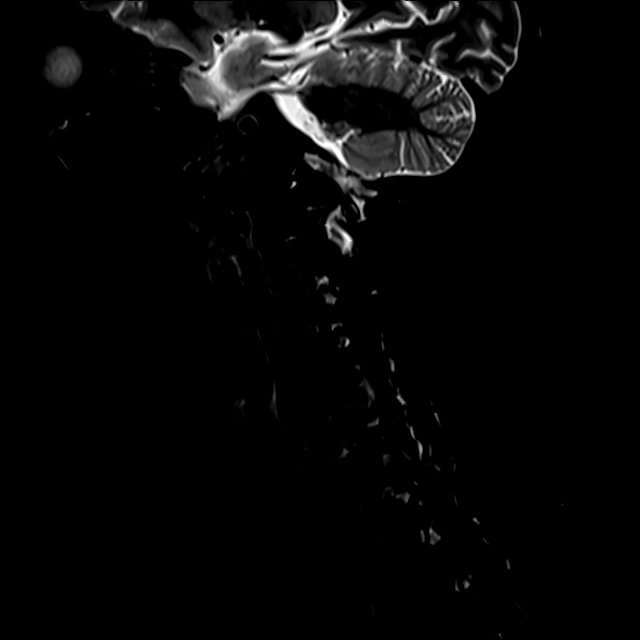
[im 8/15]
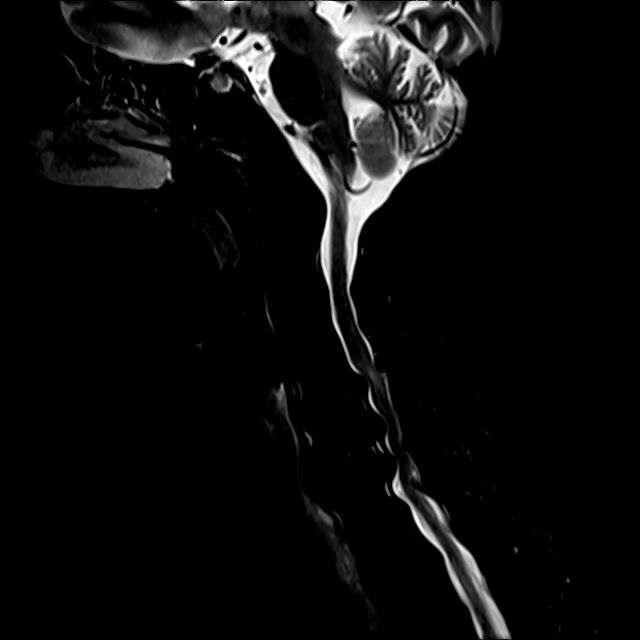
[im 12/15]
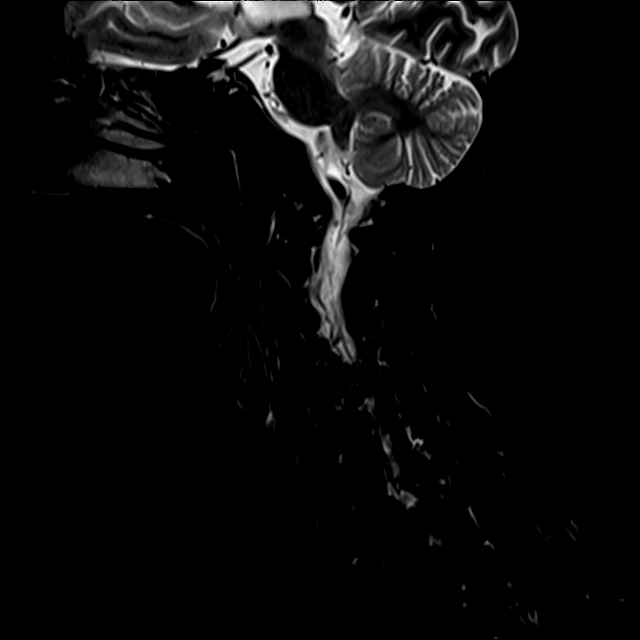

[Series 8: T2 · axial · 3.0mm · 0.50mm/px · z∈[-45,+51]mm · 8 of 32 slices shown (2 of 2)]
[im 1/32]
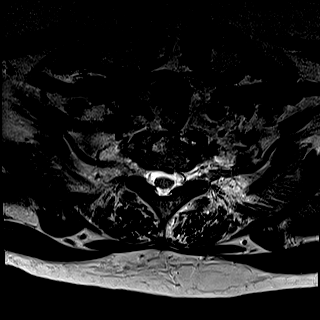
[im 5/32]
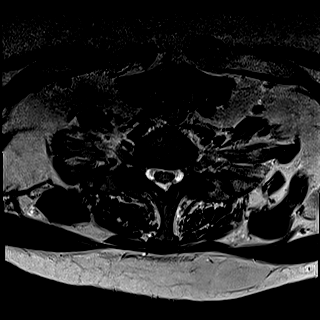
[im 10/32]
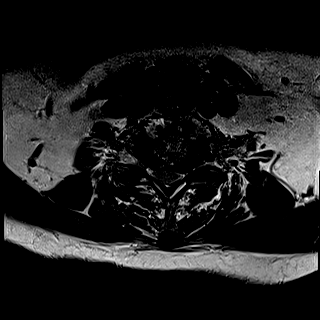
[im 15/32]
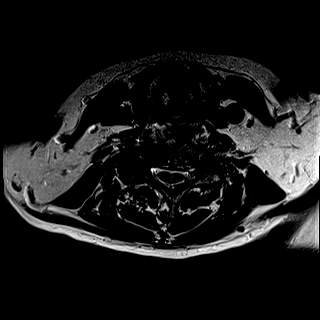
[im 17/32]
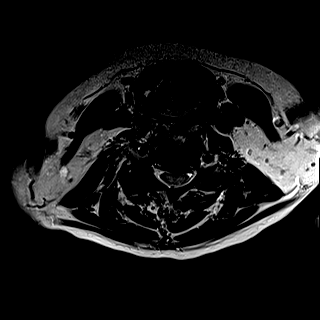
[im 22/32]
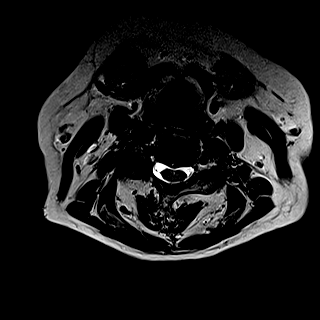
[im 27/32]
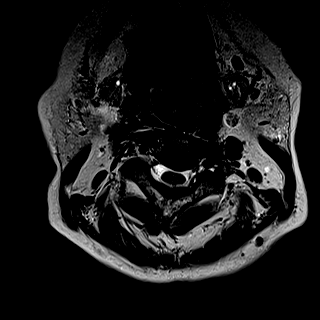
[im 32/32]
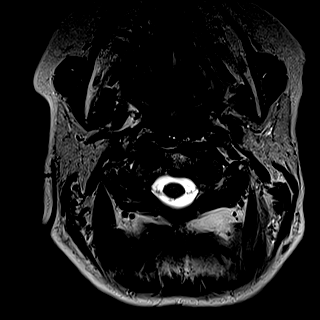

[26 of 48 positions shown; findings below may reference images not displayed]

FINDINGS: Alignment: Straightening of the expected cervical lordosis. 2 mm
C3-C4 grade 1 anterolisthesis. Trace C4-C5 grade 1 anterolisthesis.
2 mm C7-T1 and T1-T2 grade 1 anterolisthesis.

Vertebrae: Vertebral body height is maintained. Multilevel
degenerative endplate irregularity and mixed degenerative endplate
marrow signal. This includes mild multilevel degenerative endplate
edema. T1 and T2 vertebral body hemangiomas. Multilevel ventral
osteophytes.

Cord: T2 hyperintense signal abnormality within the spinal cord at
C4-C5 compatible with spinal cord edema and/or myelomalacia. Subtle
T2 hyperintensity is also questioned within the spinal cord at the
C6-C7 level which may reflect subtle edema.

Posterior Fossa, vertebral arteries, paraspinal tissues: Cerebellar
atrophy. Flow voids preserved within the imaged cervical vertebral
arteries. Paraspinal soft tissues within normal limits.

Disc levels:

Multilevel disc degeneration. Most notably, there is moderate/severe
disc degeneration at C4-C5 and C5-C6 and severe disc degeneration at
C6-C7.

C2-C3: Shallow disc bulge. Facet and ligamentum flavum hypertrophy.
Mild spinal canal stenosis. No significant foraminal narrowing.

C3-C4: Grade 1 anterolisthesis. Disc uncovering. Facet hypertrophy
(greater on the right) with mild ligamentum flavum hypertrophy. No
significant spinal canal stenosis. Moderate right neural foraminal
narrowing.

C4-C5: Disc bulge with endplate spurring. Bilateral disc osteophyte
ridge/uncinate hypertrophy. Facet and ligamentum flavum hypertrophy.
Severe spinal canal stenosis with spinal cord impingement. T2
hyperintense signal abnormality within the spinal cord at this level
compatible with edema and/or myelomalacia. Severe bilateral neural
foraminal narrowing.

C5-C6: Disc bulge with endplate spurring. Bilateral disc osteophyte
ridge/uncinate hypertrophy. Facet hypertrophy. Severe spinal canal
stenosis with spinal cord impingement. Severe bilateral neural
foraminal narrowing.

C6-C7: Disc bulge with endplate spurring. Bilateral disc osteophyte
ridge/uncinate hypertrophy. Facet and ligamentum flavum hypertrophy.
Severe spinal canal stenosis with spinal cord impingement. Subtle T2
hyperintense signal abnormality is questioned within the spinal cord
at this level, which may reflect subtle edema (series 5, image 7)
(series 7, image 7). Bilateral neural foraminal narrowing
(moderate/severe right, severe left).

C7-T1: Grade 1 anterolisthesis. Disc uncovering with slight disc
bulge. Facet and ligamentum flavum hypertrophy. No significant
spinal canal stenosis. Mild relative left neural foraminal
narrowing.

Impressions 2, 3, 4 and 5 will be called to the ordering clinician
or representative by the Radiologist Assistant, and communication
documented in the PACS or [REDACTED].
IMPRESSION: Advanced cervical spondylosis as outlined with findings most notably
as follows.

At C4-C5, there is multifactorial severe spinal canal stenosis with
spinal cord impingement. Spinal cord signal abnormality at this
level compatible with edema and/or myelomalacia. Severe bilateral
neural foraminal narrowing.

At C5-C6, there is multifactorial severe spinal canal stenosis with
spinal cord impingement. Severe bilateral neural foraminal
narrowing.

At C6-C7, there is multifactorial severe spinal canal stenosis with
spinal cord impingement. Minimal T2 hyperintensity is questioned
within the spinal cord at this level, which may reflect subtle
edema. Bilateral neural foraminal narrowing (moderate/severe right,
severe left).

Neurosurgical consultation is recommended.

## 2022-01-04 ENCOUNTER — Encounter: Payer: Self-pay | Admitting: Family Medicine

## 2022-01-04 DIAGNOSIS — E538 Deficiency of other specified B group vitamins: Secondary | ICD-10-CM

## 2022-01-06 ENCOUNTER — Encounter: Payer: Self-pay | Admitting: Family Medicine

## 2022-01-06 ENCOUNTER — Other Ambulatory Visit (INDEPENDENT_AMBULATORY_CARE_PROVIDER_SITE_OTHER): Payer: Medicare Other

## 2022-01-06 DIAGNOSIS — E538 Deficiency of other specified B group vitamins: Secondary | ICD-10-CM | POA: Diagnosis not present

## 2022-01-06 LAB — VITAMIN B12: Vitamin B-12: >1500 pg/mL — ABNORMAL HIGH (ref 211–911)

## 2022-01-26 ENCOUNTER — Telehealth: Payer: Self-pay

## 2022-01-26 NOTE — Telephone Encounter (Signed)
PA initiated:   Anahy Rohner Key: [id number]

## 2022-01-28 ENCOUNTER — Encounter: Payer: Self-pay | Admitting: Family Medicine

## 2022-01-28 NOTE — Telephone Encounter (Signed)
Denial reason per fax:  " We denied this requet under Medicare Part D because the Social Security Act permits the exclusion of certain drugs or classes of drugs from coverage under Part , Bimatoprost 0.03% solution is a drug used for the treatment of cosmetic purpse or hair growth, which is one oth the excluded classes of drugs listed in the Social Security Act. Because Bimatoprost 0.03% solution is excluded from coverage under this Act, as well as listed as an exclusion in your Evidence of Coverage document, we are denying your request. "

## 2022-02-03 ENCOUNTER — Encounter: Payer: Self-pay | Admitting: Family Medicine

## 2022-02-05 ENCOUNTER — Telehealth: Payer: Self-pay | Admitting: Family Medicine

## 2022-02-05 NOTE — Telephone Encounter (Signed)
Left message for patient to call back and schedule Medicare Annual Wellness Visit (AWV) either virtually or phone  . Left  my jabber number [phone]   Last AWV 01/26/21 please schedule with Nurse Health Adviser 2   45 min for awv-i and in office appointments 30 min for awv-s  phone/virtual appointments  

## 2022-04-26 ENCOUNTER — Encounter: Payer: Self-pay | Admitting: Podiatry

## 2022-05-05 ENCOUNTER — Ambulatory Visit (INDEPENDENT_AMBULATORY_CARE_PROVIDER_SITE_OTHER): Payer: Medicare HMO | Admitting: *Deleted

## 2022-05-05 DIAGNOSIS — Z0000 Encounter for general adult medical examination without abnormal findings: Secondary | ICD-10-CM | POA: Diagnosis not present

## 2022-05-05 NOTE — Progress Notes (Signed)
Subjective:  Pt completed ADLs, Fall risk, & SDOH during e-check in on 05/04/22.  Answers verified with pt.    Alex Moshier is a 77 y.o. female who presents for Medicare Annual (Subsequent) preventive examination.  I connected with  Yuleisy Valdivieso on 05/05/22 by a audio enabled telemedicine application and verified that I am speaking with the correct person using two identifiers.  Patient Location: Home  Provider Location: Office/Clinic  I discussed the limitations of evaluation and management by telemedicine. The patient expressed understanding and agreed to proceed.   Review of Systems    Defer to PCP Cardiac Risk Factors include: advanced age (>78mn, >>64women);dyslipidemia;hypertension     Objective:    There were no vitals filed for this visit. There is no height or weight on file to calculate BMI.     05/05/2022    1:45 PM 01/26/2021    2:27 PM 04/23/2019    2:07 PM  Advanced Directives  Does Patient Have a Medical Advance Directive? No Yes Yes  Type of ACorporate treasurerof AHendleyLiving will H[place]in Chart?  No - copy requested   Would patient like information on creating a medical advance directive? No - Patient declined      Current Medications (verified) Outpatient Encounter Medications as of 05/05/2022  Medication Sig   acetaminophen (TYLENOL) 650 MG CR tablet Take 650 mg by mouth 2 (two) times daily as needed for pain.   Ascorbic Acid (VITAMIN C) 1000 MG tablet Take 1,000 mg by mouth.   bimatoprost (LATISSE) 0.03 % ophthalmic solution Apply 1 drop to upper eyelids once daily   chlorpheniramine (CHLOR-TRIMETON) 4 MG tablet Take 1 tablet by mouth 2 (two) times daily.   Cholecalciferol (VITAMIN D3) 2000 units TABS Take 1 tablet by mouth daily.   diclofenac (VOLTAREN) 75 MG EC tablet Take 1 tablet (75 mg total) by mouth 2 (two) times daily. Use as needed for joint pain    losartan-hydrochlorothiazide (HYZAAR) 100-25 MG tablet Take 1 tablet by mouth daily.   Multiple Vitamin (MULTIVITAMIN) capsule Take 1 capsule by mouth daily.   OVER THE COUNTER MEDICATION Take 1,000 mg by mouth 2 (two) times daily. TURMERIC   Probiotic CAPS Take 1 capsule by mouth daily.   No facility-administered encounter medications on file as of 05/05/2022.    Allergies (verified) Amlodipine, Aspirin, Citrullus vulgaris, Clindamycin, Codeine, Diphenhydramine hcl (sleep), Loratadine, Metronidazole, Penicillins, Pineapple, and Tramadol   History: Past Medical History:  Diagnosis Date   Arthritis    Chicken pox    High cholesterol    Hypertension    Seasonal allergies    Past Surgical History:  Procedure Laterality Date   APPENDECTOMY  1969   AUGMENTATION MAMMAPLASTY     CESAREAN SECTION  1969   Family History  Problem Relation Age of Onset   Cancer Mother    Early death Mother    Hypertension Mother    Alcohol abuse Father    Arthritis Father    Asthma Father    COPD Father    Heart disease Father    Hypertension Father    Asthma Daughter    Social History   Socioeconomic History   Marital status: Married    Spouse name: Not on file   Number of children: Not on file   Years of education: Not on file   Highest education level: Not on file  Occupational History   Not  on file  Tobacco Use   Smoking status: Never   Smokeless tobacco: Never  Vaping Use   Vaping Use: Never used  Substance and Sexual Activity   Alcohol use: Yes    Comment: daily   Drug use: No   Sexual activity: Not on file  Other Topics Concern   Not on file  Social History Narrative   Not on file   Social Determinants of Health   Financial Resource Strain: Low Risk  (05/04/2022)   Overall Financial Resource Strain (CARDIA)    Difficulty of Paying Living Expenses: Not very hard  Food Insecurity: No Food Insecurity (05/04/2022)   Hunger Vital Sign    Worried About Running Out of Food in  the Last Year: Never true    Ran Out of Food in the Last Year: Never true  Transportation Needs: No Transportation Needs (05/04/2022)   PRAPARE - Hydrologist (Medical): No    Lack of Transportation (Non-Medical): No  Physical Activity: Insufficiently Active (05/04/2022)   Exercise Vital Sign    Days of Exercise per Week: 1 day    Minutes of Exercise per Session: 10 min  Stress: No Stress Concern Present (05/04/2022)   [place]    Feeling of Stress : Not at all  Social Connections: Socially Isolated (05/04/2022)   Social Connection and Isolation Panel [NHANES]    Frequency of Communication with Friends and Family: Once a week    Frequency of Social Gatherings with Friends and Family: Once a week    Attends Religious Services: Never    Marine scientist or Organizations: No    Attends Music therapist: Never    Marital Status: Married    Tobacco Counseling Counseling given: Not Answered   Clinical Intake:  Pre-visit preparation completed: Yes  Pain : No/denies pain  Diabetes: No  How often do you need to have someone help you when you read instructions, pamphlets, or other written materials from your doctor or pharmacy?: 1 - Never   Activities of Daily Living    05/04/2022    5:19 PM  In your present state of health, do you have any difficulty performing the following activities:  Hearing? 1  Comment wears hearing aids  Vision? 0  Difficulty concentrating or making decisions? 0  Walking or climbing stairs? 0  Dressing or bathing? 0  Doing errands, shopping? 0  Preparing Food and eating ? N  Using the Toilet? N  In the past six months, have you accidently leaked urine? N  Do you have problems with loss of bowel control? N  Managing your Medications? N  Managing your Finances? N  Housekeeping or managing your Housekeeping? N    Patient Care  Team: Copland, Gay Filler, MD as PCP - General (Family Medicine)  Indicate any recent Medical Services you may have received from other than [facility] providers in the past year (date may be approximate).     Assessment:   This is a routine wellness examination for [place].  Hearing/Vision screen No results found.  Dietary issues and exercise activities discussed: Current Exercise Habits: The patient does not participate in regular exercise at present, Exercise limited by: None identified   Goals Addressed   None    Depression Screen    05/05/2022    1:44 PM 01/26/2021    2:26 PM 10/20/2020    2:10 PM  PHQ 2/9 Scores  PHQ -  2 Score 0 0 0    Fall Risk    05/04/2022    5:19 PM 01/26/2021    2:29 PM 01/26/2021    1:28 PM 01/25/2021    2:48 PM 10/20/2020    2:13 PM  Fall Risk   Falls in the past year? 0 0 0 0 0  Number falls in past yr: 0 0 0 0   Injury with Fall? 0 0 0 0   Risk for fall due to : No Fall Risks      Follow up Falls evaluation completed Falls prevention discussed       FALL RISK PREVENTION PERTAINING TO THE HOME:  Any stairs in or around the home? Yes  If so, are there any without handrails? No  Home free of loose throw rugs in walkways, pet beds, electrical cords, etc? Yes  Adequate lighting in your home to reduce risk of falls? Yes   ASSISTIVE DEVICES UTILIZED TO PREVENT FALLS:  Life alert? No  Use of a cane, walker or w/c? No  Grab bars in the bathroom? No  Shower chair or bench in shower? Yes  Elevated toilet seat or a handicapped toilet?  Comfort height  TIMED UP AND GO:  Was the test performed?  No, audio visit .    Cognitive Function:        05/05/2022    1:50 PM  6CIT Screen  What Year? 0 points  What month? 0 points  What time? 0 points  Count back from 20 0 points  Months in reverse 0 points  Repeat phrase 4 points  Total Score 4 points    Immunizations Immunization History  Administered Date(s) Administered   Influenza Split  03/22/1988, 07/15/2012   Janssen (J&J) SARS-COV-2 Vaccination 06/26/2019   Pneumococcal Conjugate-13 03/26/2016   Pneumococcal Polysaccharide-23 07/15/2012, 04/23/2013    TDAP status: Due, Education has been provided regarding the importance of this vaccine. Advised may receive this vaccine at local pharmacy or Health Dept. Aware to provide a copy of the vaccination record if obtained from local pharmacy or Health Dept. Verbalized acceptance and understanding.  Flu Vaccine status: Declined, Education has been provided regarding the importance of this vaccine but patient still declined. Advised may receive this vaccine at local pharmacy or Health Dept. Aware to provide a copy of the vaccination record if obtained from local pharmacy or Health Dept. Verbalized acceptance and understanding.  Pneumococcal vaccine status: Up to date  Covid-19 vaccine status: Information provided on how to obtain vaccines.   Qualifies for Shingles Vaccine? Yes   Zostavax completed No   Shingrix Completed?: No.    Education has been provided regarding the importance of this vaccine. Patient has been advised to call insurance company to determine out of pocket expense if they have not yet received this vaccine. Advised may also receive vaccine at local pharmacy or Health Dept. Verbalized acceptance and understanding.  Screening Tests Health Maintenance  Topic Date Due   DTaP/Tdap/Td (1 - Tdap) Never done   Zoster Vaccines- Shingrix (1 of 2) Never done   COVID-19 Vaccine (2 - 2023-24 season) 11/20/2021   Medicare Annual Wellness (AWV)  01/26/2022   Pneumonia Vaccine 3+ Years old  Completed   DEXA SCAN  Completed   Hepatitis C Screening  Completed   HPV VACCINES  Aged Out   INFLUENZA VACCINE  Discontinued    Health Maintenance  Health Maintenance Due  Topic Date Due   DTaP/Tdap/Td (1 - Tdap) Never done  Zoster Vaccines- Shingrix (1 of 2) Never done   COVID-19 Vaccine (2 - 2023-24 season) 11/20/2021    Medicare Annual Wellness (AWV)  01/26/2022    Colorectal cancer screening: No longer required.   Mammogram status: Completed 10/28/20. Repeat every year  Bone Density status: Ordered 09/28/21. Pt provided with contact info and advised to call to schedule appt.  Lung Cancer Screening: (Low Dose CT Chest recommended if Age 7-80 years, 30 pack-year currently smoking OR have quit w/in 15years.) does not qualify.   Additional Screening:  Hepatitis C Screening: does qualify; Completed 03/08/18  Vision Screening: Recommended annual ophthalmology exams for early detection of glaucoma and other disorders of the eye. Is the patient up to date with their annual eye exam?  Yes  Who is the provider or what is the name of the office in which the patient attends annual eye exams? Dr. Pamalee Leyden If pt is not established with a provider, would they like to be referred to a provider to establish care? No .   Dental Screening: Recommended annual dental exams for proper oral hygiene  Community Resource Referral / Chronic Care Management: CRR required this visit?  No   CCM required this visit?  No      Plan:     I have personally reviewed and noted the following in the patient's chart:   Medical and social history Use of alcohol, tobacco or illicit drugs  Current medications and supplements including opioid prescriptions. Patient is not currently taking opioid prescriptions. Functional ability and status Nutritional status Physical activity Advanced directives List of other physicians Hospitalizations, surgeries, and ER visits in previous 12 months Vitals Screenings to include cognitive, depression, and falls Referrals and appointments  In addition, I have reviewed and discussed with patient certain preventive protocols, quality metrics, and best practice recommendations. A written personalized care plan for preventive services as well as general preventive health recommendations were  provided to patient.   Due to this being a telephonic visit, the after visit summary with patients personalized plan was offered to patient via mail or my-chart. Patient declined at this time.  Beatris Ship, [place]   05/05/2022   Nurse Notes: None

## 2022-05-05 NOTE — Patient Instructions (Signed)
Jennifer Gonzalez , Thank you for taking time to come for your Medicare Wellness Visit. I appreciate your ongoing commitment to your health goals. Please review the following plan we discussed and let me know if I can assist you in the future.   These are the goals we discussed:  Goals      Patient Stated     Would like to improve posture        This is a list of the screening recommended for you and due dates:  Health Maintenance  Topic Date Due   DTaP/Tdap/Td vaccine (1 - Tdap) Never done   Zoster (Shingles) Vaccine (1 of 2) Never done   COVID-19 Vaccine (2 - 2023-24 season) 11/20/2021   Medicare Annual Wellness Visit  05/06/2023   Pneumonia Vaccine  Completed   DEXA scan (bone density measurement)  Completed   Hepatitis C Screening: USPSTF Recommendation to screen - Ages 5-79 yo.  Completed   HPV Vaccine  Aged Out   Flu Shot  Discontinued     Next appointment: Follow up in one year for your annual wellness visit.   Preventive Care 32 Years and Older, Female Preventive care refers to lifestyle choices and visits with your health care provider that can promote health and wellness. What does preventive care include? A yearly physical exam. This is also called an annual well check. Dental exams once or twice a year. Routine eye exams. Ask your health care provider how often you should have your eyes checked. Personal lifestyle choices, including: Daily care of your teeth and gums. Regular physical activity. Eating a healthy diet. Avoiding tobacco and drug use. Limiting alcohol use. Practicing safe sex. Taking low-dose aspirin every day. Taking vitamin and mineral supplements as recommended by your health care provider. What happens during an annual well check? The services and screenings done by your health care provider during your annual well check will depend on your age, overall health, lifestyle risk factors, and family history of disease. Counseling  Your health care  provider may ask you questions about your: Alcohol use. Tobacco use. Drug use. Emotional well-being. Home and relationship well-being. Sexual activity. Eating habits. History of falls. Memory and ability to understand (cognition). Work and work Statistician. Reproductive health. Screening  You may have the following tests or measurements: Height, weight, and BMI. Blood pressure. Lipid and cholesterol levels. These may be checked every 5 years, or more frequently if you are over 59 years old. Skin check. Lung cancer screening. You may have this screening every year starting at age 64 if you have a 30-pack-year history of smoking and currently smoke or have quit within the past 15 years. Fecal occult blood test (FOBT) of the stool. You may have this test every year starting at age 98. Flexible sigmoidoscopy or colonoscopy. You may have a sigmoidoscopy every 5 years or a colonoscopy every 10 years starting at age 30. Hepatitis C blood test. Hepatitis B blood test. Sexually transmitted disease (STD) testing. Diabetes screening. This is done by checking your blood sugar (glucose) after you have not eaten for a while (fasting). You may have this done every 1-3 years. Bone density scan. This is done to screen for osteoporosis. You may have this done starting at age 51. Mammogram. This may be done every 1-2 years. Talk to your health care provider about how often you should have regular mammograms. Talk with your health care provider about your test results, treatment options, and if necessary, the need for more  tests. Vaccines  Your health care provider may recommend certain vaccines, such as: Influenza vaccine. This is recommended every year. Tetanus, diphtheria, and acellular pertussis (Tdap, Td) vaccine. You may need a Td booster every 10 years. Zoster vaccine. You may need this after age 36. Pneumococcal 13-valent conjugate (PCV13) vaccine. One dose is recommended after age  73. Pneumococcal polysaccharide (PPSV23) vaccine. One dose is recommended after age 90. Talk to your health care provider about which screenings and vaccines you need and how often you need them. This information is not intended to replace advice given to you by your health care provider. Make sure you discuss any questions you have with your health care provider. Document Released: 04/04/2015 Document Revised: 11/26/2015 Document Reviewed: 01/07/2015 Elsevier Interactive Patient Education  2017 [place] Prevention in the Home Falls can cause injuries. They can happen to people of all ages. There are many things you can do to make your home safe and to help prevent falls. What can I do on the outside of my home? Regularly fix the edges of walkways and driveways and fix any cracks. Remove anything that might make you trip as you walk through a door, such as a raised step or threshold. Trim any bushes or trees on the path to your home. Use bright outdoor lighting. Clear any walking paths of anything that might make someone trip, such as rocks or tools. Regularly check to see if handrails are loose or broken. Make sure that both sides of any steps have handrails. Any raised decks and porches should have guardrails on the edges. Have any leaves, snow, or ice cleared regularly. Use sand or salt on walking paths during winter. Clean up any spills in your garage right away. This includes oil or grease spills. What can I do in the bathroom? Use night lights. Install grab bars by the toilet and in the tub and shower. Do not use towel bars as grab bars. Use non-skid mats or decals in the tub or shower. If you need to sit down in the shower, use a plastic, non-slip stool. Keep the floor dry. Clean up any water that spills on the floor as soon as it happens. Remove soap buildup in the tub or shower regularly. Attach bath mats securely with double-sided non-slip rug tape. Do not have throw  rugs and other things on the floor that can make you trip. What can I do in the bedroom? Use night lights. Make sure that you have a light by your bed that is easy to reach. Do not use any sheets or blankets that are too big for your bed. They should not hang down onto the floor. Have a firm chair that has side arms. You can use this for support while you get dressed. Do not have throw rugs and other things on the floor that can make you trip. What can I do in the kitchen? Clean up any spills right away. Avoid walking on wet floors. Keep items that you use a lot in easy-to-reach places. If you need to reach something above you, use a strong step stool that has a grab bar. Keep electrical cords out of the way. Do not use floor polish or wax that makes floors slippery. If you must use wax, use non-skid floor wax. Do not have throw rugs and other things on the floor that can make you trip. What can I do with my stairs? Do not leave any items on the stairs. Make sure  that there are handrails on both sides of the stairs and use them. Fix handrails that are broken or loose. Make sure that handrails are as long as the stairways. Check any carpeting to make sure that it is firmly attached to the stairs. Fix any carpet that is loose or worn. Avoid having throw rugs at the top or bottom of the stairs. If you do have throw rugs, attach them to the floor with carpet tape. Make sure that you have a light switch at the top of the stairs and the bottom of the stairs. If you do not have them, ask someone to add them for you. What else can I do to help prevent falls? Wear shoes that: Do not have high heels. Have rubber bottoms. Are comfortable and fit you well. Are closed at the toe. Do not wear sandals. If you use a stepladder: Make sure that it is fully opened. Do not climb a closed stepladder. Make sure that both sides of the stepladder are locked into place. Ask someone to hold it for you, if  possible. Clearly mark and make sure that you can see: Any grab bars or handrails. First and last steps. Where the edge of each step is. Use tools that help you move around (mobility aids) if they are needed. These include: Canes. Walkers. Scooters. Crutches. Turn on the lights when you go into a dark area. Replace any light bulbs as soon as they burn out. Set up your furniture so you have a clear path. Avoid moving your furniture around. If any of your floors are uneven, fix them. If there are any pets around you, be aware of where they are. Review your medicines with your doctor. Some medicines can make you feel dizzy. This can increase your chance of falling. Ask your doctor what other things that you can do to help prevent falls. This information is not intended to replace advice given to you by your health care provider. Make sure you discuss any questions you have with your health care provider. Document Released: 01/02/2009 Document Revised: 08/14/2015 Document Reviewed: 04/12/2014 Elsevier Interactive Patient Education  2017 [employer].

## 2022-05-30 IMAGING — MG DIGITAL SCREENING BREAST BILAT IMPLANT W/ TOMO W/ CAD
8 of 12 series · 8 of 28 positions shown · non-contrast
Comparison: Previous exam(s).

CLINICAL DATA: Screening.

EXAM:
DIGITAL SCREENING BILATERAL MAMMOGRAM WITH IMPLANTS, CAD AND
TOMOSYNTHESIS
TECHNIQUE: Bilateral screening digital craniocaudal and mediolateral oblique
mammograms were obtained. Bilateral screening digital breast
tomosynthesis was performed. The images were evaluated with
computer-aided detection. Standard and/or implant displaced views
were performed.

[L CC]
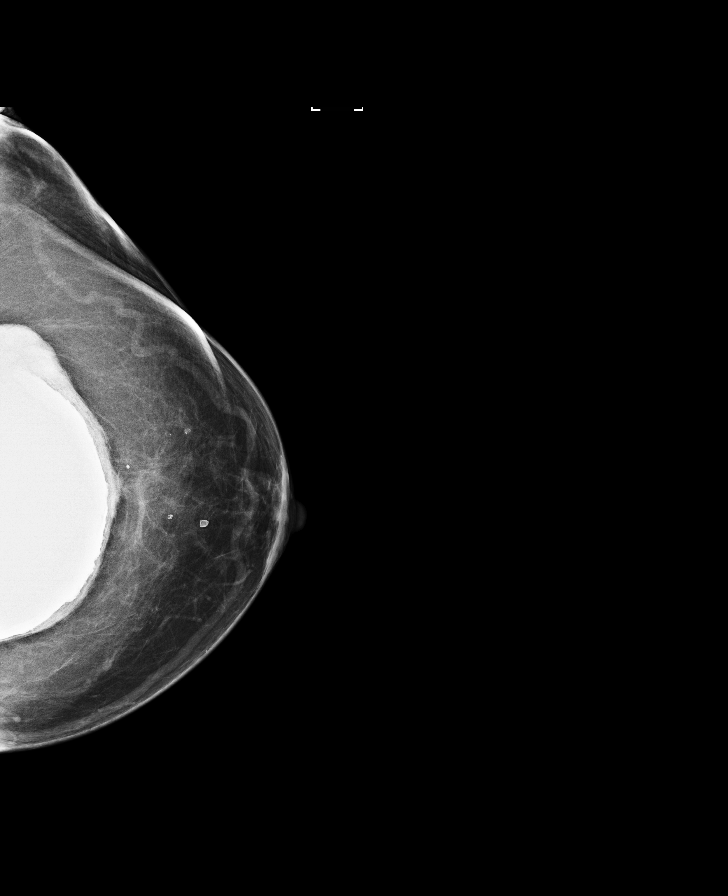

[R MLO]
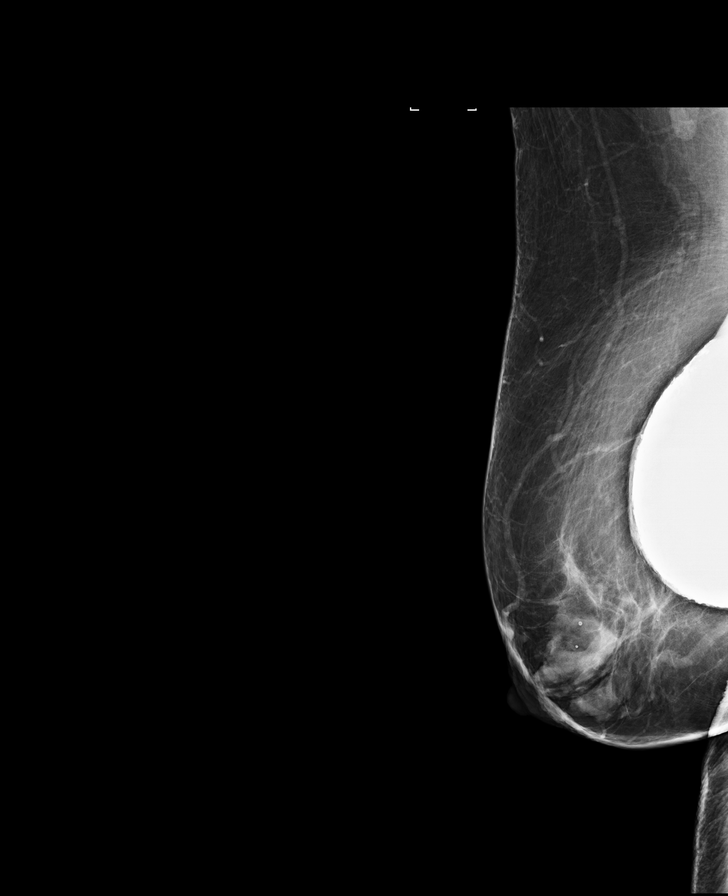

[L MLO]
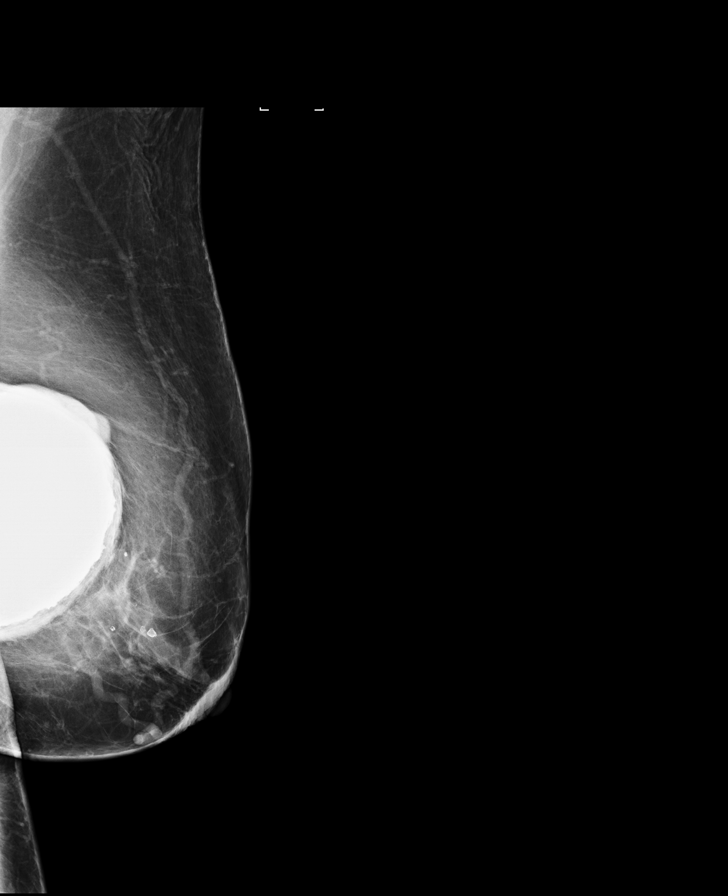

[R CC]
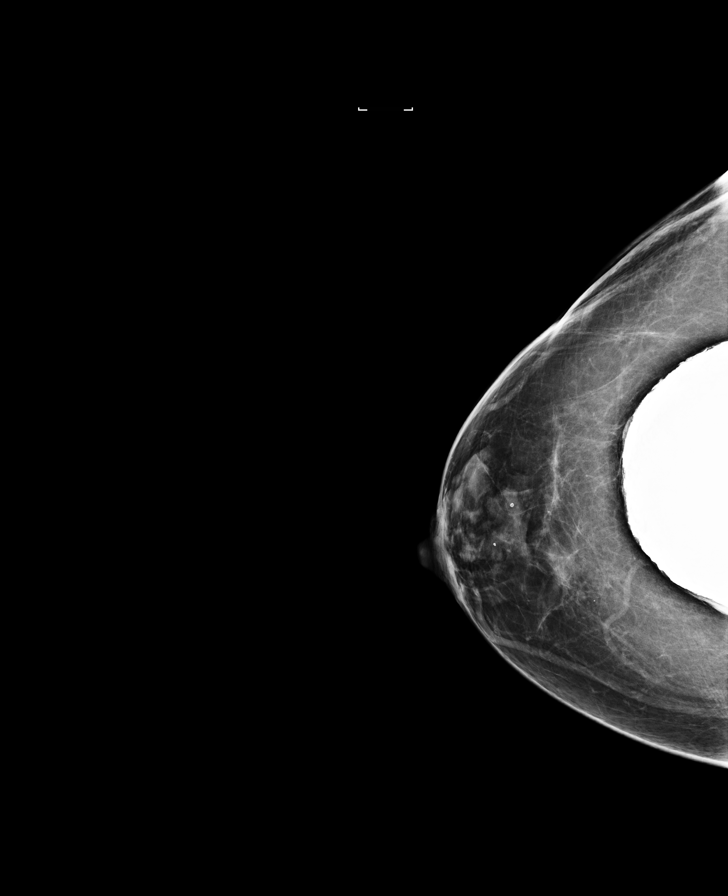

[R MLO synth-2D]
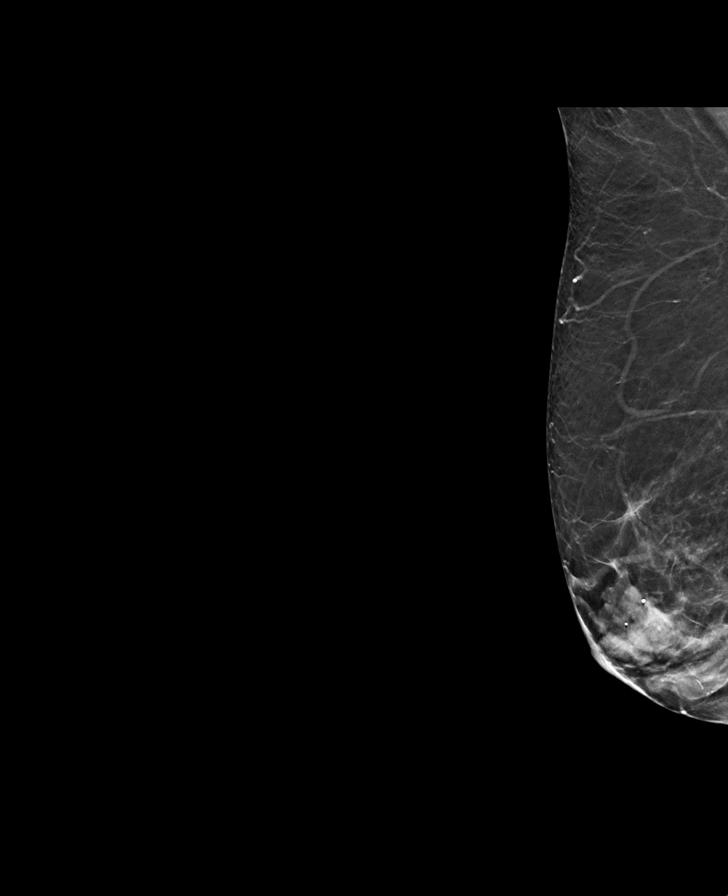

[R CC synth-2D]
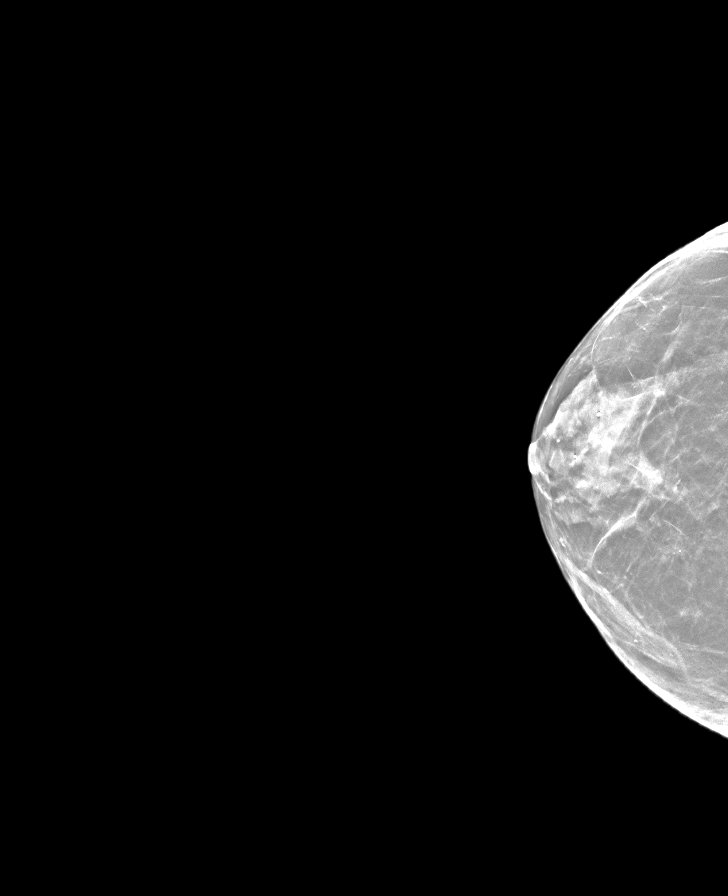

[L CC synth-2D]
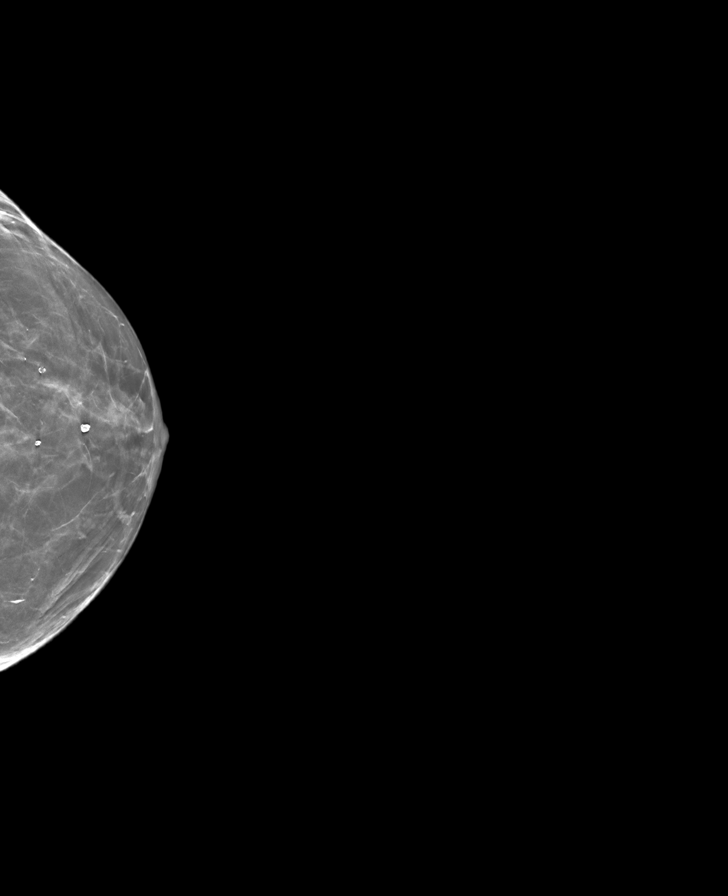

[L MLO synth-2D]
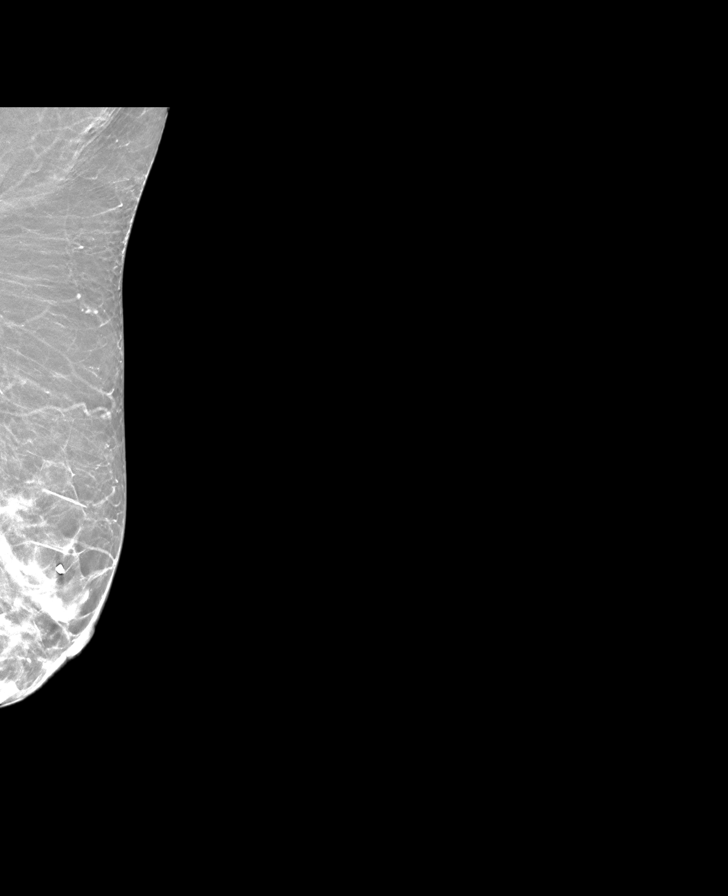

[8 of 28 positions shown; findings below may reference images not displayed]

ACR Breast Density Category b: There are scattered areas of
fibroglandular density.
FINDINGS: The patient has implants. There are no findings suspicious for
malignancy.

Possible implant rupture bilaterally again noted.
IMPRESSION: No mammographic evidence of malignancy. A result letter of this
screening mammogram will be mailed directly to the patient.

RECOMMENDATION:
Screening mammogram in one year. (Code:LT-[id number])

BI-RADS CATEGORY  1:  Negative.

## 2022-08-05 DIAGNOSIS — H35363 Drusen (degenerative) of macula, bilateral: Secondary | ICD-10-CM | POA: Diagnosis not present

## 2022-08-05 DIAGNOSIS — H524 Presbyopia: Secondary | ICD-10-CM | POA: Diagnosis not present

## 2022-08-05 DIAGNOSIS — H52203 Unspecified astigmatism, bilateral: Secondary | ICD-10-CM | POA: Diagnosis not present

## 2022-08-05 DIAGNOSIS — H43393 Other vitreous opacities, bilateral: Secondary | ICD-10-CM | POA: Diagnosis not present

## 2022-08-05 DIAGNOSIS — H02834 Dermatochalasis of left upper eyelid: Secondary | ICD-10-CM | POA: Diagnosis not present

## 2022-08-05 DIAGNOSIS — H25013 Cortical age-related cataract, bilateral: Secondary | ICD-10-CM | POA: Diagnosis not present

## 2022-08-05 DIAGNOSIS — H02831 Dermatochalasis of right upper eyelid: Secondary | ICD-10-CM | POA: Diagnosis not present

## 2022-08-05 DIAGNOSIS — H2513 Age-related nuclear cataract, bilateral: Secondary | ICD-10-CM | POA: Diagnosis not present

## 2022-08-25 DIAGNOSIS — H25812 Combined forms of age-related cataract, left eye: Secondary | ICD-10-CM | POA: Diagnosis not present

## 2022-08-25 DIAGNOSIS — H2512 Age-related nuclear cataract, left eye: Secondary | ICD-10-CM | POA: Diagnosis not present

## 2022-08-26 DIAGNOSIS — Z961 Presence of intraocular lens: Secondary | ICD-10-CM | POA: Diagnosis not present

## 2022-09-07 DIAGNOSIS — Z961 Presence of intraocular lens: Secondary | ICD-10-CM | POA: Diagnosis not present

## 2022-09-27 DIAGNOSIS — H5213 Myopia, bilateral: Secondary | ICD-10-CM | POA: Diagnosis not present

## 2022-09-27 DIAGNOSIS — Z961 Presence of intraocular lens: Secondary | ICD-10-CM | POA: Diagnosis not present

## 2022-09-27 DIAGNOSIS — H524 Presbyopia: Secondary | ICD-10-CM | POA: Diagnosis not present

## 2022-09-27 DIAGNOSIS — H52203 Unspecified astigmatism, bilateral: Secondary | ICD-10-CM | POA: Diagnosis not present

## 2022-11-04 ENCOUNTER — Encounter: Payer: Self-pay | Admitting: Podiatry

## 2022-11-04 ENCOUNTER — Ambulatory Visit: Payer: Medicare HMO | Admitting: Podiatry

## 2022-11-04 DIAGNOSIS — M19071 Primary osteoarthritis, right ankle and foot: Secondary | ICD-10-CM | POA: Diagnosis not present

## 2022-11-04 DIAGNOSIS — M19072 Primary osteoarthritis, left ankle and foot: Secondary | ICD-10-CM

## 2022-11-04 MED ORDER — TRIAMCINOLONE ACETONIDE 10 MG/ML IJ SUSP
2.5000 mg | Freq: Once | INTRAMUSCULAR | Status: AC
  Administered 2022-11-04: 2.5 mg via INTRA_ARTICULAR

## 2022-11-04 MED ORDER — DEXAMETHASONE SODIUM PHOSPHATE 120 MG/30ML IJ SOLN
4.0000 mg | Freq: Once | INTRAMUSCULAR | Status: AC
  Administered 2022-11-04: 4 mg via INTRA_ARTICULAR

## 2022-11-04 NOTE — Progress Notes (Signed)
  Subjective:  Patient ID: Jennifer Gonzalez, female    DOB: [birth year],   MRN: [id number]  Chief Complaint  Patient presents with   Foot Pain    Pt present today for pain on the top of both feet.    77 y.o. female presents for follow-up of bilateral midfoot arthritis. Relates the pain is getting worse and making her foot stiff. She relates she got an alert on her phone that she could fall and was concerned about her gait. She also relates some concern with the color changes in her toes but has improved. She is not a diabetic . Denies any other pedal complaints. Denies n/v/f/c.   Past Medical History:  Diagnosis Date   Arthritis    Chicken pox    High cholesterol    Hypertension    Seasonal allergies     Objective:  Physical Exam: Vascular: DP/PT pulses 2/4 bilateral. CFT <3 seconds. Normal hair growth on digits. No edema.  Skin. No lacerations or abrasions bilateral feet.  Musculoskeletal: MMT 5/5 bilateral lower extremities in DF, PF, Inversion and Eversion. Deceased ROM in DF of ankle joint. Tender over dorsum of foot across all TMTJ. Minimal ROM of the rays and minimal pain.  Neurological: Sensation intact to light touch.   Assessment:   1. Arthritis of both midfeet       Plan:  Patient was evaluated and treated and all questions answered. X-rays reviewed and discussed with patient. No acute fractures or dislocations noted.  Significant degenerative changes noted throughout midfoot bilateral. Spurring noted to posterior heel bilateral. Varus deformity noted to lesser digits.  Discussed midfoot arthritis with patient and treatment options.  Discussed NSAIDS, topicals, and possible injections.  Injection offered today.  Procedure below.  Discussed PT to help with gait issues. Amb ref placed. Discussed stiff soled shoes and carbon fiber foot plate.  Discussed if pain does not improve can discuss surgical options.  Patient to follow-up as needed.    Procedure: Injection  Tendon/Ligament Discussed alternatives, risks, complications and verbal consent was obtained.  Location: Bilateral second tarsometatarsal joint. Skin Prep: Alcohol. Injectate: 1cc 0.5% marcaine plain, 1 cc dexamethasone 0.5 cc kenalog  Disposition: Patient tolerated procedure well. Injection site dressed with a band-aid.  Post-injection care was discussed and return precautions discussed.     Louann Sjogren, DPM

## 2022-11-09 NOTE — Therapy (Signed)
OUTPATIENT PHYSICAL THERAPY LOWER EXTREMITY EVALUATION   Patient Name: Jennifer Gonzalez MRN: [id number] DOB:[birth year], 77 y.o., female Today's Date: 11/10/2022  END OF SESSION:  PT End of Session - 11/10/22 1444     Visit Number 1    Number of Visits 12    Date for PT Re-Evaluation 12/22/22    Authorization Type [employer] -    Progress Note Due on Visit 10    PT Start Time 1444    PT Stop Time 1530    PT Time Calculation (min) 46 min    Activity Tolerance Patient tolerated treatment well    Behavior During Therapy WFL for tasks assessed/performed             Past Medical History:  Diagnosis Date   Arthritis    Chicken pox    High cholesterol    Hypertension    Seasonal allergies    Past Surgical History:  Procedure Laterality Date   APPENDECTOMY  1969   AUGMENTATION MAMMAPLASTY     CESAREAN SECTION  1969   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    PCP: Copland, Gwenlyn Found, MD   REFERRING PROVIDER: Louann Sjogren, DPM   REFERRING DIAG: [phone] (ICD-10-CM) - Arthritis of both midfeet   THERAPY DIAG:  Pain in right ankle and joints of right foot  Pain in left ankle and joints of left foot  Unsteadiness on feet  Rationale for Evaluation and Treatment: Rehabilitation  ONSET DATE: years ago, worsened in the past year  SUBJECTIVE:   SUBJECTIVE STATEMENT: 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 Pain has worsened in the past year. Had injections in both feet last Thursday and that has helped. I felt like my balance was off, but it's better since injections. Can walk in grocery store now without hanging on to buggy.   PERTINENT HISTORY: OA, HTN, B knee pain PAIN:  Are you having pain? Yes: NPRS scale: 3/10 Pain location: dorsum of feet mid foot and into metatarsals  B Pain description: like I can't bend my feet and the bones are rubbing together Aggravating factors: cold weather Relieving factors: heat  PRECAUTIONS: None  RED FLAGS: None   WEIGHT BEARING RESTRICTIONS: No  FALLS:  Has patient fallen in last 6 months? No  LIVING ENVIRONMENT: Lives with: lives with their spouse Lives in: House/apartment Stairs: Yes: Internal: 12 steps; can reach both and External: 6 steps; can reach both Has following equipment at home: None  OCCUPATION: works as a day-trader  PLOF: Independent  PATIENT GOALS: I want to feel comfortable walking  NEXT MD VISIT: none scheduled  OBJECTIVE:   DIAGNOSTIC FINDINGS:  LEFT FOOT - COMPLETE 3+ VIEW   COMPARISON:  None.   FINDINGS: There are mild degenerative changes at the first metatarsophalangeal joint with joint space narrowing and osteophyte formation. There also degenerative changes with joint space narrowing and osteophyte formation of the dorsal midfoot, particularly the first tarsal metatarsal joint. There are small posterior endplate or calcaneal spurs. Soft tissues are within normal limits.   IMPRESSION: 1. No acute bony abnormality. 2. Mild degenerative changes of the foot.  RIGHT FOOT COMPLETE - 3+ VIEW   COMPARISON:  None.   FINDINGS: No acute fracture or dislocation. There are mild degenerative changes with joint space narrowing and osteophyte formation at the first metatarsophalangeal joint  and over the dorsal midfoot as well as first tarsometatarsal junction. There is a small posterior calcaneal spur. Soft tissues are within normal limits.   IMPRESSION: 1. No acute bony abnormality. 2. Mild degenerative changes of the foot.  PATIENT SURVEYS:  LEFS  42 / 80 = 52.5 %  COGNITION: Overall cognitive status: Within functional limits for tasks assessed     SENSATION: WFL   MUSCLE LENGTH: Marked tightness in B gastroc/soleus  POSTURE:  left pes planus  PALPATION: Tender  with cuboid mobs B R>L decreased metatarsal  and TMT joint mobility  LOWER EXTREMITY ROM:  A/P ROM Right eval Left eval  Hip flexion    Hip extension    Hip abduction    Hip adduction    Hip internal rotation    Hip external rotation    Knee flexion    Knee extension    Ankle dorsiflexion -5/-1 -4/0  Ankle plantarflexion    Ankle inversion    Ankle eversion     (Blank rows = not tested)  LOWER EXTREMITY MMT: B ankles grossly 4+/5 to 5/5  MMT Right eval Left eval  Hip flexion    Hip extension    Hip abduction    Hip adduction    Hip internal rotation    Hip external rotation    Knee flexion    Knee extension     (Blank rows = not tested)   FUNCTIONAL TESTS:  5 times sit to stand: 15.81 sec pain in knees Timed up and go (TUG): 12.23 sec Dynamic Gait Index: 18/24  GAIT: Distance walked: 60 Assistive device utilized: None Level of assistance: Complete Independence Comments: deviates from straight path, intermittent LOB   TODAY'S TREATMENT:                                                                                                                              DATE:   11/10/22 See pt ed and HEP   PATIENT EDUCATION:  Education details: anticipated POC and initial HEP  Person educated: Patient Education method: Explanation, Demonstration, and Handouts Education comprehension: verbalized understanding and returned demonstration  HOME EXERCISE PROGRAM: Access Code: [email or web address] URL: [email or web address] Date: 11/10/2022 Prepared by: Raynelle Fanning  Exercises - Standing Plantar Fascia Mobilization with Small Ball  - 1 x daily - 7 x weekly - 1 sets - 10 reps - Gastroc Stretch on Wall  - 2 x daily - 7 x weekly - 1 sets - 3 reps - 30-sec  hold - Standing Soleus Stretch  - 2 x daily - 7 x weekly - 3 reps - 30s hold - Seated Dorsiflexion Stretch  - 2 x daily - 7 x weekly - 1 sets - 10 reps  ASSESSMENT:  CLINICAL IMPRESSION: Patient is a 78 y.o. female  who was seen today for physical therapy evaluation and treatment for B mid foot pain and unsteadiness. She has had increased foot pain over the past year due to  arthritis. She has less pain since injections last week, but is still limited with walking. She has deficits in ankle ROM and LE functional strength  as well as balance. Her DGI indicates she is at risk for falls. She will benefit from skilled PT to address these deficits.     OBJECTIVE IMPAIRMENTS: Abnormal gait, decreased balance, decreased ROM, decreased strength, hypomobility, impaired flexibility, postural dysfunction, and pain.   ACTIVITY LIMITATIONS: squatting, transfers, and locomotion level  PARTICIPATION LIMITATIONS: cleaning, shopping, community activity, and yard work  PERSONAL FACTORS: Age, Time since onset of injury/illness/exacerbation, and 1 comorbidity: B knee pain  are also affecting patient's functional outcome.   REHAB POTENTIAL: Good  CLINICAL DECISION MAKING: Evolving/moderate complexity  EVALUATION COMPLEXITY: Moderate   GOALS: Goals reviewed with patient? Yes  SHORT TERM GOALS: Target date: 11/24/2022   Patient will be independent with initial HEP. Baseline:  Goal status: INITIAL  2.  Patient will be educated on strategies to decrease risk of falls.  Baseline:  Goal status: INITIAL   LONG TERM GOALS: Target date: 12/22/2022  Patient will be independent with advanced/ongoing HEP to improve outcomes and carryover.  Baseline:  Goal status: INITIAL  2.  Patient will be able to ambulate 600' with good safety and no increase in foot pain to access community.  Baseline:  Goal status: INITIAL  3.  Patient to report decreased foot pain by 75% with walking to improve QOL. Baseline:  Goal status: INITIAL   4.  Patient will demonstrate improved functional LE strength as demonstrated by decreased 5xSTS time to = 12.6 sec (age norm). Baseline:  Goal status: INITIAL  5.  Patient will demonstrate at least  19/24 on DGI to improve gait stability and reduce risk for falls. Baseline: 18 Goal status: INITIAL  6.  Patient will score 51/80 on LEFS showing functional improvement. Baseline: 42 / 80 = 52.5 % Goal status: INITIAL     PLAN:  PT FREQUENCY: 2x/week  PT DURATION: 6 weeks  PLANNED INTERVENTIONS: Therapeutic exercises, Therapeutic activity, Neuromuscular re-education, Balance training, Gait training, Patient/Family education, Self Care, Joint mobilization, Aquatic Therapy, Dry Needling, Electrical stimulation, Spinal mobilization, Cryotherapy, Moist heat, Taping, Ultrasound, and Manual therapy  PLAN FOR NEXT SESSION: Assess MCTSIB, check hip strength, Manual to feet to increase joint mobility, heel cord flexibility, balance/gait   Solon Palm, PT  11/10/2022, 4:00 PM

## 2022-11-10 ENCOUNTER — Encounter: Payer: Self-pay | Admitting: Physical Therapy

## 2022-11-10 ENCOUNTER — Other Ambulatory Visit: Payer: Self-pay

## 2022-11-10 ENCOUNTER — Ambulatory Visit: Payer: Medicare HMO | Attending: Podiatry | Admitting: Physical Therapy

## 2022-11-10 DIAGNOSIS — M25572 Pain in left ankle and joints of left foot: Secondary | ICD-10-CM | POA: Diagnosis not present

## 2022-11-10 DIAGNOSIS — R2681 Unsteadiness on feet: Secondary | ICD-10-CM | POA: Insufficient documentation

## 2022-11-10 DIAGNOSIS — M19072 Primary osteoarthritis, left ankle and foot: Secondary | ICD-10-CM | POA: Diagnosis not present

## 2022-11-10 DIAGNOSIS — M19071 Primary osteoarthritis, right ankle and foot: Secondary | ICD-10-CM | POA: Diagnosis not present

## 2022-11-10 DIAGNOSIS — M25571 Pain in right ankle and joints of right foot: Secondary | ICD-10-CM | POA: Insufficient documentation

## 2022-11-16 ENCOUNTER — Ambulatory Visit: Payer: Medicare HMO | Admitting: Physical Therapy

## 2022-11-16 ENCOUNTER — Encounter: Payer: Self-pay | Admitting: Physical Therapy

## 2022-11-16 DIAGNOSIS — M25572 Pain in left ankle and joints of left foot: Secondary | ICD-10-CM

## 2022-11-16 DIAGNOSIS — M19072 Primary osteoarthritis, left ankle and foot: Secondary | ICD-10-CM | POA: Diagnosis not present

## 2022-11-16 DIAGNOSIS — R2681 Unsteadiness on feet: Secondary | ICD-10-CM | POA: Diagnosis not present

## 2022-11-16 DIAGNOSIS — M25571 Pain in right ankle and joints of right foot: Secondary | ICD-10-CM

## 2022-11-16 DIAGNOSIS — M19071 Primary osteoarthritis, right ankle and foot: Secondary | ICD-10-CM | POA: Diagnosis not present

## 2022-11-16 NOTE — Therapy (Signed)
OUTPATIENT PHYSICAL THERAPY LOWER EXTREMITY TREATMENT   Patient Name: Jennifer Gonzalez MRN: [id number] DOB:07-24-1945, 77 y.o., female Today's Date: 11/16/2022  END OF SESSION:  PT End of Session - 11/16/22 1522     Visit Number 2    Number of Visits 12    Date for PT Re-Evaluation 12/22/22    Authorization Type [employer] -    Progress Note Due on Visit 10    PT Start Time 1524    PT Stop Time 1613    PT Time Calculation (min) 49 min    Activity Tolerance Patient tolerated treatment well    Behavior During Therapy WFL for tasks assessed/performed              Past Medical History:  Diagnosis Date   Arthritis    Chicken pox    High cholesterol    Hypertension    Seasonal allergies    Past Surgical History:  Procedure Laterality Date   APPENDECTOMY  1969   AUGMENTATION MAMMAPLASTY     CESAREAN SECTION  1969   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    PCP: Copland, Gwenlyn Found, MD   REFERRING PROVIDER: Louann Sjogren, DPM   REFERRING DIAG: [phone] (ICD-10-CM) - Arthritis of both midfeet   THERAPY DIAG:  Pain in right ankle and joints of right foot  Pain in left ankle and joints of left foot  Unsteadiness on feet  Rationale for Evaluation and Treatment: Rehabilitation  ONSET DATE: years ago, worsened in the past year  SUBJECTIVE:   SUBJECTIVE STATEMENT: The exercises really have to help.   PERTINENT HISTORY: OA, HTN, B knee pain PAIN:  Are you having pain? Yes: NPRS scale: 0/10 Pain location: dorsum of feet mid foot and into metatarsals B Pain description: like I can't bend my feet and the bones are rubbing together Aggravating factors: cold weather Relieving factors: heat  PRECAUTIONS: None  RED FLAGS: None   WEIGHT BEARING RESTRICTIONS: No  FALLS:  Has patient fallen in last 6  months? No  LIVING ENVIRONMENT: Lives with: lives with their spouse Lives in: House/apartment Stairs: Yes: Internal: 12 steps; can reach both and External: 6 steps; can reach both Has following equipment at home: None  OCCUPATION: works as a day-trader  PLOF: Independent  PATIENT GOALS: I want to feel comfortable walking  NEXT MD VISIT: none scheduled  OBJECTIVE:   DIAGNOSTIC FINDINGS:  LEFT FOOT - COMPLETE 3+ VIEW   COMPARISON:  None.   FINDINGS: There are mild degenerative changes at the first metatarsophalangeal joint with joint space narrowing and osteophyte formation. There also degenerative changes with joint space narrowing and osteophyte formation of the dorsal midfoot, particularly the first tarsal metatarsal joint. There are small posterior endplate or calcaneal spurs. Soft tissues are within normal limits.   IMPRESSION: 1. No acute bony abnormality. 2. Mild degenerative changes of the foot.  RIGHT FOOT COMPLETE - 3+ VIEW   COMPARISON:  None.   FINDINGS: No acute fracture or dislocation. There are mild degenerative changes with joint space narrowing and osteophyte formation at the first metatarsophalangeal joint and over the dorsal midfoot as well as first tarsometatarsal junction. There is a small posterior calcaneal spur. Soft tissues are within normal limits.   IMPRESSION: 1. No acute bony abnormality. 2. Mild degenerative changes of the foot.  PATIENT SURVEYS:  LEFS  42 / 80 =  52.5 %  COGNITION: Overall cognitive status: Within functional limits for tasks assessed     SENSATION: WFL   MUSCLE LENGTH: Marked tightness in B gastroc/soleus  POSTURE:  left pes planus  PALPATION: Tender with cuboid mobs B R>L decreased metatarsal  and TMT joint mobility  LOWER EXTREMITY ROM:  A/P ROM Right eval Left eval  Hip flexion    Hip extension    Hip abduction    Hip adduction    Hip internal rotation    Hip external rotation    Knee  flexion    Knee extension    Ankle dorsiflexion -5/-1 -4/0  Ankle plantarflexion    Ankle inversion    Ankle eversion     (Blank rows = not tested)  LOWER EXTREMITY MMT: B ankles grossly 4+/5 to 5/5  MMT Right eval Left eval  Hip flexion 4+ 4+  Hip extension 5 5  Hip abduction 4- 5  Hip adduction 4- 5  Hip internal rotation    Hip external rotation    Knee flexion    Knee extension     (Blank rows = not tested)   FUNCTIONAL TESTS:  5 times sit to stand: 15.81 sec pain in knees Timed up and go (TUG): 12.23 sec Dynamic Gait Index: 18/24 MCTSIB: Condition 1: Avg of 3 trials: 30 sec, Condition 2: Avg of 3 trials: 30 sec, Condition 3: Avg of 3 trials: 30 sec, Condition 4: Avg of 3 trials: 30 sec, and Total Score: 120/120 SLS L > 10 sec, R <= 4 sec  GAIT: Distance walked: 60 Assistive device utilized: None Level of assistance: Complete Independence Comments: deviates from straight path, intermittent LOB   TODAY'S TREATMENT:                                                                                                                              DATE:   11/16/22 Checked hip strength MCTSIB completed - see objective Sidelying hip ABD 2x10 R Clam x 10 R (easy) - done for assessment Gastroc and soleus stretch 2x30 sec B Gastroc stretch on incline B x 30 sec Seated DF stretch x 10 sec for review; also did soleus briefly Also demonstrated plantar fascia stretch with toes on wall  SLS B no UE support SLS star touches x 5 B to color dots semi circle Seated arch lifts 5 sec hold x 5 Standing arch lifts 5 sec hold x 5  Manual: STM to B plantar fascia, cuboid and metatarsal mobs B  11/10/22 See pt ed and HEP   PATIENT EDUCATION:  Education details: anticipated POC and initial HEP  Person educated: Patient Education method: Explanation, Demonstration, and Handouts Education comprehension: verbalized understanding and returned demonstration  HOME EXERCISE  PROGRAM: Access Code: [email or web address] URL: [email or web address] Date: 11/10/2022 Prepared by: Raynelle Fanning  Exercises - Standing Plantar Fascia Mobilization with Small Ball  - 1 x daily - 7 x weekly - 1 sets - 10 reps -  Gastroc Stretch on Wall  - 2 x daily - 7 x weekly - 1 sets - 3 reps - 30-sec  hold - Standing Soleus Stretch  - 2 x daily - 7 x weekly - 3 reps - 30s hold - Seated Dorsiflexion Stretch  - 2 x daily - 7 x weekly - 1 sets - 10 reps  ASSESSMENT:  CLINICAL IMPRESSION: Mashelle reports no pain today and that her pain has already been helped a lot by initial HEP. We assessed further balance with SLS and MCTSIB. She scored 120/120 on the latter. SLS is more difficult on the R LE and star taps R were more of a "step to" the dot. She will benefit from ongoing higher level balance activities. Additionally her R gluteus med is weak with MMT. She needs some cueing to keep her R foot perpendicular to wall with gastroc/soleus stretches.    OBJECTIVE IMPAIRMENTS: Abnormal gait, decreased balance, decreased ROM, decreased strength, hypomobility, impaired flexibility, postural dysfunction, and pain.   ACTIVITY LIMITATIONS: squatting, transfers, and locomotion level  PARTICIPATION LIMITATIONS: cleaning, shopping, community activity, and yard work  PERSONAL FACTORS: Age, Time since onset of injury/illness/exacerbation, and 1 comorbidity: B knee pain  are also affecting patient's functional outcome.   REHAB POTENTIAL: Good  CLINICAL DECISION MAKING: Evolving/moderate complexity  EVALUATION COMPLEXITY: Moderate   GOALS: Goals reviewed with patient? Yes  SHORT TERM GOALS: Target date: 11/24/2022   Patient will be independent with initial HEP. Baseline:  Goal status: INITIAL  2.  Patient will be educated on strategies to decrease risk of falls.  Baseline:  Goal status: INITIAL   LONG TERM GOALS: Target date: 12/22/2022  Patient will be independent with advanced/ongoing HEP to  improve outcomes and carryover.  Baseline:  Goal status: INITIAL  2.  Patient will be able to ambulate 600' with good safety and no increase in foot pain to access community.  Baseline:  Goal status: INITIAL  3.  Patient to report decreased foot pain by 75% with walking to improve QOL. Baseline:  Goal status: INITIAL   4.  Patient will demonstrate improved functional LE strength as demonstrated by decreased 5xSTS time to = 12.6 sec (age norm). Baseline:  Goal status: INITIAL  5.  Patient will demonstrate at least 19/24 on DGI to improve gait stability and reduce risk for falls. Baseline: 18 Goal status: INITIAL  6.  Patient will score 51/80 on LEFS showing functional improvement. Baseline: 42 / 80 = 52.5 % Goal status: INITIAL     PLAN:  PT FREQUENCY: 2x/week  PT DURATION: 6 weeks  PLANNED INTERVENTIONS: Therapeutic exercises, Therapeutic activity, Neuromuscular re-education, Balance training, Gait training, Patient/Family education, Self Care, Joint mobilization, Aquatic Therapy, Dry Needling, Electrical stimulation, Spinal mobilization, Cryotherapy, Moist heat, Taping, Ultrasound, and Manual therapy  PLAN FOR NEXT SESSION:continue Manual to feet to increase joint mobility, heel cord flexibility, SLS balance/gait, R glute med strength   Solon Palm, PT  11/16/2022, 4:26 PM

## 2022-11-18 ENCOUNTER — Other Ambulatory Visit: Payer: Self-pay

## 2022-11-18 ENCOUNTER — Ambulatory Visit: Payer: Medicare HMO

## 2022-11-18 DIAGNOSIS — M25572 Pain in left ankle and joints of left foot: Secondary | ICD-10-CM | POA: Diagnosis not present

## 2022-11-18 DIAGNOSIS — M25571 Pain in right ankle and joints of right foot: Secondary | ICD-10-CM

## 2022-11-18 DIAGNOSIS — M19072 Primary osteoarthritis, left ankle and foot: Secondary | ICD-10-CM | POA: Diagnosis not present

## 2022-11-18 DIAGNOSIS — R2681 Unsteadiness on feet: Secondary | ICD-10-CM | POA: Diagnosis not present

## 2022-11-18 DIAGNOSIS — M19071 Primary osteoarthritis, right ankle and foot: Secondary | ICD-10-CM | POA: Diagnosis not present

## 2022-11-18 NOTE — Therapy (Signed)
OUTPATIENT PHYSICAL THERAPY LOWER EXTREMITY TREATMENT   Patient Name: Jennifer Gonzalez MRN: [id number] DOB:1945/06/10, 77 y.o., female Today's Date: 11/18/2022  END OF SESSION:  PT End of Session - 11/18/22 1727     Visit Number 3    Number of Visits 12    Date for PT Re-Evaluation 12/22/22    Authorization Type [employer] -    Progress Note Due on Visit 10    PT Start Time 1450    PT Stop Time 1530    PT Time Calculation (min) 40 min              Past Medical History:  Diagnosis Date   Arthritis    Chicken pox    High cholesterol    Hypertension    Seasonal allergies    Past Surgical History:  Procedure Laterality Date   APPENDECTOMY  1969   AUGMENTATION MAMMAPLASTY     CESAREAN SECTION  1969   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    PCP: Copland, Gwenlyn Found, MD   REFERRING PROVIDER: Louann Sjogren, DPM   REFERRING DIAG: [phone] (ICD-10-CM) - Arthritis of both midfeet   THERAPY DIAG:  Pain in right ankle and joints of right foot  Pain in left ankle and joints of left foot  Unsteadiness on feet  Rationale for Evaluation and Treatment: Rehabilitation  ONSET DATE: years ago, worsened in the past year  SUBJECTIVE:   SUBJECTIVE STATEMENT: Patient states that she has some soreness on the dorsum of the L foot above the metatarsals from the manual therapy earlier this week. Otherwise, things are going well and she is pleased how quickly things have improved.  PERTINENT HISTORY: OA, HTN, B knee pain PAIN:  Are you having pain? Yes: NPRS scale: 0/10 Pain location: dorsum of feet mid foot and into metatarsals B Pain description: like I can't bend my feet and the bones are rubbing together Aggravating factors: cold weather Relieving factors: heat  PRECAUTIONS: None  RED FLAGS: None   WEIGHT  BEARING RESTRICTIONS: No  FALLS:  Has patient fallen in last 6 months? No  LIVING ENVIRONMENT: Lives with: lives with their spouse Lives in: House/apartment Stairs: Yes: Internal: 12 steps; can reach both and External: 6 steps; can reach both Has following equipment at home: None  OCCUPATION: works as a day-trader  PLOF: Independent  PATIENT GOALS: I want to feel comfortable walking  NEXT MD VISIT: none scheduled  OBJECTIVE:   DIAGNOSTIC FINDINGS:  LEFT FOOT - COMPLETE 3+ VIEW   COMPARISON:  None.   FINDINGS: There are mild degenerative changes at the first metatarsophalangeal joint with joint space narrowing and osteophyte formation. There also degenerative changes with joint space narrowing and osteophyte formation of the dorsal midfoot, particularly the first tarsal metatarsal joint. There are small posterior endplate or calcaneal spurs. Soft tissues are within normal limits.   IMPRESSION: 1. No acute bony abnormality. 2. Mild degenerative changes of the foot.  RIGHT FOOT COMPLETE - 3+ VIEW   COMPARISON:  None.   FINDINGS: No acute fracture or dislocation. There are mild degenerative changes with joint space narrowing and osteophyte formation at the first metatarsophalangeal joint and over the dorsal midfoot as well as first tarsometatarsal junction. There is a small posterior calcaneal spur. Soft tissues are within normal limits.   IMPRESSION: 1. No acute bony abnormality. 2. Mild degenerative changes  of the foot.  PATIENT SURVEYS:  LEFS  42 / 80 = 52.5 %  COGNITION: Overall cognitive status: Within functional limits for tasks assessed     SENSATION: WFL   MUSCLE LENGTH: Marked tightness in B gastroc/soleus  POSTURE:  left pes planus  PALPATION: Tender with cuboid mobs B R>L decreased metatarsal  and TMT joint mobility  LOWER EXTREMITY ROM:  A/P ROM Right eval Left eval  Hip flexion    Hip extension    Hip abduction    Hip adduction     Hip internal rotation    Hip external rotation    Knee flexion    Knee extension    Ankle dorsiflexion -5/-1 -4/0  Ankle plantarflexion    Ankle inversion    Ankle eversion     (Blank rows = not tested)  LOWER EXTREMITY MMT: B ankles grossly 4+/5 to 5/5  MMT Right eval Left eval  Hip flexion 4+ 4+  Hip extension 5 5  Hip abduction 4- 5  Hip adduction 4- 5  Hip internal rotation    Hip external rotation    Knee flexion    Knee extension     (Blank rows = not tested)   FUNCTIONAL TESTS:  5 times sit to stand: 15.81 sec pain in knees Timed up and go (TUG): 12.23 sec Dynamic Gait Index: 18/24 MCTSIB: Condition 1: Avg of 3 trials: 30 sec, Condition 2: Avg of 3 trials: 30 sec, Condition 3: Avg of 3 trials: 30 sec, Condition 4: Avg of 3 trials: 30 sec, and Total Score: 120/120 SLS L > 10 sec, R <= 4 sec  GAIT: Distance walked: 60 Assistive device utilized: None Level of assistance: Complete Independence Comments: deviates from straight path, intermittent LOB   TODAY'S TREATMENT:                                                                                                                              DATE:  [facility] Adult PT Treatment:                                                DATE: 11/18/2022 Therapeutic Exercise: Slantboard calf stretch x 2 min Staggered stance L foot forward calf stretch x 2 min Standing L hip extension/external rotation isometric against therapist resistance, L foot on slide disc, 3 x 30 sec Manual Therapy: Supine: calcaneal dorsiflexion glides + AP glides at talus Neuromuscular re-ed: Single leg balance on BAPs board, level 1, upper extremity assist on stair rail Single leg counterpressure on BAPs board against therapist resistance into plantarflexion, eversion, and inversion, 3 x 30 sec each   11/16/22 Checked hip strength MCTSIB completed - see objective Sidelying hip ABD 2x10 R Clam x 10 R (easy) - done for assessment Gastroc and soleus  stretch 2x30 sec B  Gastroc stretch on incline B x 30 sec Seated DF stretch x 10 sec for review; also did soleus briefly Also demonstrated plantar fascia stretch with toes on wall  SLS B no UE support SLS star touches x 5 B to color dots semi circle Seated arch lifts 5 sec hold x 5 Standing arch lifts 5 sec hold x 5  Manual: STM to B plantar fascia, cuboid and metatarsal mobs B  11/10/22 See pt ed and HEP   PATIENT EDUCATION:  Education details: anticipated POC and initial HEP  Person educated: Patient Education method: Explanation, Demonstration, and Handouts Education comprehension: verbalized understanding and returned demonstration  HOME EXERCISE PROGRAM: Access Code: [email or web address] URL: [email or web address] Date: 11/10/2022 Prepared by: Raynelle Fanning  Exercises - Standing Plantar Fascia Mobilization with Small Ball  - 1 x daily - 7 x weekly - 1 sets - 10 reps - Gastroc Stretch on Wall  - 2 x daily - 7 x weekly - 1 sets - 3 reps - 30-sec  hold - Standing Soleus Stretch  - 2 x daily - 7 x weekly - 3 reps - 30s hold - Seated Dorsiflexion Stretch  - 2 x daily - 7 x weekly - 1 sets - 10 reps  ASSESSMENT:  CLINICAL IMPRESSION: Continued addressing L ankle dorsiflexion joint and soft tissue mobility, followed with L lower extremity balance and strengthening. Patient did have some lateral L hip pain with single leg exercises, may be contractile in nature with the location posterior to the greater trochanter - may consider this area for treatment at future sessions if it continues to be bothersome. Physical therapy remains indicated.    OBJECTIVE IMPAIRMENTS: Abnormal gait, decreased balance, decreased ROM, decreased strength, hypomobility, impaired flexibility, postural dysfunction, and pain.   ACTIVITY LIMITATIONS: squatting, transfers, and locomotion level  PARTICIPATION LIMITATIONS: cleaning, shopping, community activity, and yard work  PERSONAL FACTORS: Age, Time since  onset of injury/illness/exacerbation, and 1 comorbidity: B knee pain  are also affecting patient's functional outcome.   REHAB POTENTIAL: Good  CLINICAL DECISION MAKING: Evolving/moderate complexity  EVALUATION COMPLEXITY: Moderate   GOALS: Goals reviewed with patient? Yes  SHORT TERM GOALS: Target date: 11/24/2022   Patient will be independent with initial HEP. Baseline:  Goal status: INITIAL  2.  Patient will be educated on strategies to decrease risk of falls.  Baseline:  Goal status: INITIAL   LONG TERM GOALS: Target date: 12/22/2022  Patient will be independent with advanced/ongoing HEP to improve outcomes and carryover.  Baseline:  Goal status: INITIAL  2.  Patient will be able to ambulate 600' with good safety and no increase in foot pain to access community.  Baseline:  Goal status: INITIAL  3.  Patient to report decreased foot pain by 75% with walking to improve QOL. Baseline:  Goal status: INITIAL   4.  Patient will demonstrate improved functional LE strength as demonstrated by decreased 5xSTS time to = 12.6 sec (age norm). Baseline:  Goal status: INITIAL  5.  Patient will demonstrate at least 19/24 on DGI to improve gait stability and reduce risk for falls. Baseline: 18 Goal status: INITIAL  6.  Patient will score 51/80 on LEFS showing functional improvement. Baseline: 42 / 80 = 52.5 % Goal status: INITIAL     PLAN:  PT FREQUENCY: 2x/week  PT DURATION: 6 weeks  PLANNED INTERVENTIONS: Therapeutic exercises, Therapeutic activity, Neuromuscular re-education, Balance training, Gait training, Patient/Family education, Self Care, Joint mobilization, Aquatic Therapy, Dry Needling, Electrical stimulation, Spinal mobilization, Cryotherapy, Moist heat,  Taping, Ultrasound, and Manual therapy  PLAN FOR NEXT SESSION:continue Manual to feet to increase joint mobility, heel cord flexibility, SLS balance/gait, R glute med strength   Edmonia Caprio, PT, PhD,  DPT  11/18/2022, 5:29 PM

## 2022-11-23 ENCOUNTER — Ambulatory Visit: Payer: Medicare HMO | Attending: Podiatry

## 2022-11-23 DIAGNOSIS — M25571 Pain in right ankle and joints of right foot: Secondary | ICD-10-CM | POA: Diagnosis not present

## 2022-11-23 DIAGNOSIS — M25572 Pain in left ankle and joints of left foot: Secondary | ICD-10-CM | POA: Diagnosis not present

## 2022-11-23 DIAGNOSIS — R2681 Unsteadiness on feet: Secondary | ICD-10-CM | POA: Insufficient documentation

## 2022-11-23 NOTE — Therapy (Signed)
OUTPATIENT PHYSICAL THERAPY LOWER EXTREMITY TREATMENT   Patient Name: Jennifer Gonzalez MRN: [id number] DOB:03-30-1945, 77 y.o., female Today's Date: 11/23/2022  END OF SESSION:  PT End of Session - 11/23/22 1443     Visit Number 4    Number of Visits 12    Date for PT Re-Evaluation 12/22/22    Authorization Type [employer] -    Authorization Time Period 8/21-10/2    Authorization - Visit Number 3    Authorization - Number of Visits 12    Progress Note Due on Visit 10    PT Start Time 1445    PT Stop Time 1524    PT Time Calculation (min) 39 min    Activity Tolerance Patient tolerated treatment well    Behavior During Therapy WFL for tasks assessed/performed               Past Medical History:  Diagnosis Date   Arthritis    Chicken pox    High cholesterol    Hypertension    Seasonal allergies    Past Surgical History:  Procedure Laterality Date   APPENDECTOMY  1969   AUGMENTATION MAMMAPLASTY     CESAREAN SECTION  1969   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    PCP: Copland, Gwenlyn Found, MD   REFERRING PROVIDER: Louann Sjogren, DPM   REFERRING DIAG: [phone] (ICD-10-CM) - Arthritis of both midfeet   THERAPY DIAG:  Pain in right ankle and joints of right foot  Pain in left ankle and joints of left foot  Unsteadiness on feet  Rationale for Evaluation and Treatment: Rehabilitation  ONSET DATE: years ago, worsened in the past year  SUBJECTIVE:   SUBJECTIVE STATEMENT: "Pretty good. I was really worked last time and my hip was sore. My feet felt ok."  PERTINENT HISTORY: OA, HTN, B knee pain PAIN:  Are you having pain?none currently Yes: NPRS scale: 3-4 at worst/10 Pain location: dorsum of feet mid foot and into metatarsals B Pain description: like I can't bend my feet and the bones are rubbing  together Aggravating factors: cold weather Relieving factors: heat  PRECAUTIONS: None  RED FLAGS: None   WEIGHT BEARING RESTRICTIONS: No  FALLS:  Has patient fallen in last 6 months? No  LIVING ENVIRONMENT: Lives with: lives with their spouse Lives in: House/apartment Stairs: Yes: Internal: 12 steps; can reach both and External: 6 steps; can reach both Has following equipment at home: None  OCCUPATION: works as a day-trader  PLOF: Independent  PATIENT GOALS: I want to feel comfortable walking  NEXT MD VISIT: none scheduled  OBJECTIVE:   DIAGNOSTIC FINDINGS:  LEFT FOOT - COMPLETE 3+ VIEW   COMPARISON:  None.   FINDINGS: There are mild degenerative changes at the first metatarsophalangeal joint with joint space narrowing and osteophyte formation. There also degenerative changes with joint space narrowing and osteophyte formation of the dorsal midfoot, particularly the first tarsal metatarsal joint. There are small posterior endplate or calcaneal spurs. Soft tissues are within normal limits.   IMPRESSION: 1. No acute bony abnormality. 2. Mild degenerative changes of the foot.  RIGHT FOOT COMPLETE - 3+ VIEW   COMPARISON:  None.   FINDINGS: No acute fracture or dislocation. There are mild degenerative changes with joint space narrowing and osteophyte formation at the first metatarsophalangeal joint and over the dorsal midfoot as well as first tarsometatarsal  junction. There is a small posterior calcaneal spur. Soft tissues are within normal limits.   IMPRESSION: 1. No acute bony abnormality. 2. Mild degenerative changes of the foot.  PATIENT SURVEYS:  LEFS  42 / 80 = 52.5 %  COGNITION: Overall cognitive status: Within functional limits for tasks assessed     SENSATION: WFL   MUSCLE LENGTH: Marked tightness in B gastroc/soleus  POSTURE:  left pes planus  PALPATION: Tender with cuboid mobs B R>L decreased metatarsal  and TMT joint  mobility  LOWER EXTREMITY ROM:  A/P ROM Right eval Left eval  Hip flexion    Hip extension    Hip abduction    Hip adduction    Hip internal rotation    Hip external rotation    Knee flexion    Knee extension    Ankle dorsiflexion -5/-1 -4/0  Ankle plantarflexion    Ankle inversion    Ankle eversion     (Blank rows = not tested)  LOWER EXTREMITY MMT: B ankles grossly 4+/5 to 5/5  MMT Right eval Left eval  Hip flexion 4+ 4+  Hip extension 5 5  Hip abduction 4- 5  Hip adduction 4- 5  Hip internal rotation    Hip external rotation    Knee flexion    Knee extension     (Blank rows = not tested)   FUNCTIONAL TESTS:  5 times sit to stand: 15.81 sec pain in knees Timed up and go (TUG): 12.23 sec Dynamic Gait Index: 18/24 MCTSIB: Condition 1: Avg of 3 trials: 30 sec, Condition 2: Avg of 3 trials: 30 sec, Condition 3: Avg of 3 trials: 30 sec, Condition 4: Avg of 3 trials: 30 sec, and Total Score: 120/120 SLS L > 10 sec, R <= 4 sec  GAIT: Distance walked: 60 Assistive device utilized: None Level of assistance: Complete Independence Comments: deviates from straight path, intermittent LOB   TODAY'S TREATMENT:                                                                                                                              [facility] Adult PT Treatment:                                                DATE: 11/23/22 Therapeutic Exercise: Calf stretch on wedge x 1 minute  Seated arch lifts 2 x 10 each  Seated great toe extension 2 x 10 each  Standing calf raises 2 x 10  Standing toe raises 2 x 10  Standing hip abduction 2 x 10  Standing hip extension 2 x 10  Standing march 2 x 10  Hip bridge 2 x 10  SLR 2 x 10  Sit to stand 2 x 10  Updated HEP    [facility] Adult PT Treatment:  DATE: 11/18/2022 Therapeutic Exercise: Slantboard calf stretch x 2 min Staggered stance L foot forward calf stretch x 2 min Standing L hip  extension/external rotation isometric against therapist resistance, L foot on slide disc, 3 x 30 sec Manual Therapy: Supine: calcaneal dorsiflexion glides + AP glides at talus Neuromuscular re-ed: Single leg balance on BAPs board, level 1, upper extremity assist on stair rail Single leg counterpressure on BAPs board against therapist resistance into plantarflexion, eversion, and inversion, 3 x 30 sec each   11/16/22 Checked hip strength MCTSIB completed - see objective Sidelying hip ABD 2x10 R Clam x 10 R (easy) - done for assessment Gastroc and soleus stretch 2x30 sec B Gastroc stretch on incline B x 30 sec Seated DF stretch x 10 sec for review; also did soleus briefly Also demonstrated plantar fascia stretch with toes on wall  SLS B no UE support SLS star touches x 5 B to color dots semi circle Seated arch lifts 5 sec hold x 5 Standing arch lifts 5 sec hold x 5  Manual: STM to B plantar fascia, cuboid and metatarsal mobs B  PATIENT EDUCATION:  Education details: HEP  Person educated: Patient Education method: Programmer, multimedia, Facilities manager, and Handouts Education comprehension: verbalized understanding and returned demonstration  HOME EXERCISE PROGRAM: Access Code: [email or web address] URL: [email or web address] Date: 11/10/2022 Prepared by: Raynelle Fanning  Exercises - Standing Plantar Fascia Mobilization with Small Ball  - 1 x daily - 7 x weekly - 1 sets - 10 reps - Gastroc Stretch on Wall  - 2 x daily - 7 x weekly - 1 sets - 3 reps - 30-sec  hold - Standing Soleus Stretch  - 2 x daily - 7 x weekly - 3 reps - 30s hold - Seated Dorsiflexion Stretch  - 2 x daily - 7 x weekly - 1 sets - 10 reps  ASSESSMENT:  CLINICAL IMPRESSION: Patient arrives without reports of pain, but notes having Lt hip pain following the last session. Focused on progression of intrinsic foot strengthening and standing strengthening with good tolerance. She reported "muscle burn" in bilateral hips with standing  hip strengthening, but no pain.    OBJECTIVE IMPAIRMENTS: Abnormal gait, decreased balance, decreased ROM, decreased strength, hypomobility, impaired flexibility, postural dysfunction, and pain.   ACTIVITY LIMITATIONS: squatting, transfers, and locomotion level  PARTICIPATION LIMITATIONS: cleaning, shopping, community activity, and yard work  PERSONAL FACTORS: Age, Time since onset of injury/illness/exacerbation, and 1 comorbidity: B knee pain  are also affecting patient's functional outcome.   REHAB POTENTIAL: Good  CLINICAL DECISION MAKING: Evolving/moderate complexity  EVALUATION COMPLEXITY: Moderate   GOALS: Goals reviewed with patient? Yes  SHORT TERM GOALS: Target date: 11/24/2022   Patient will be independent with initial HEP. Baseline:  Goal status: INITIAL  2.  Patient will be educated on strategies to decrease risk of falls.  Baseline:  Goal status: INITIAL   LONG TERM GOALS: Target date: 12/22/2022  Patient will be independent with advanced/ongoing HEP to improve outcomes and carryover.  Baseline:  Goal status: INITIAL  2.  Patient will be able to ambulate 600' with good safety and no increase in foot pain to access community.  Baseline:  Goal status: INITIAL  3.  Patient to report decreased foot pain by 75% with walking to improve QOL. Baseline:  Goal status: INITIAL   4.  Patient will demonstrate improved functional LE strength as demonstrated by decreased 5xSTS time to = 12.6 sec (age norm). Baseline:  Goal status: INITIAL  5.  Patient will demonstrate  at least 19/24 on DGI to improve gait stability and reduce risk for falls. Baseline: 18 Goal status: INITIAL  6.  Patient will score 51/80 on LEFS showing functional improvement. Baseline: 42 / 80 = 52.5 % Goal status: INITIAL     PLAN:  PT FREQUENCY: 2x/week  PT DURATION: 6 weeks  PLANNED INTERVENTIONS: Therapeutic exercises, Therapeutic activity, Neuromuscular re-education, Balance  training, Gait training, Patient/Family education, Self Care, Joint mobilization, Aquatic Therapy, Dry Needling, Electrical stimulation, Spinal mobilization, Cryotherapy, Moist heat, Taping, Ultrasound, and Manual therapy  PLAN FOR NEXT SESSION:continue Manual to feet to increase joint mobility, heel cord flexibility, SLS balance/gait, R glute med strength    Letitia Libra, PT, DPT, ATC 11/23/22 3:27 PM

## 2022-11-25 ENCOUNTER — Ambulatory Visit: Payer: Medicare HMO

## 2022-11-25 DIAGNOSIS — M25571 Pain in right ankle and joints of right foot: Secondary | ICD-10-CM | POA: Diagnosis not present

## 2022-11-25 DIAGNOSIS — R2681 Unsteadiness on feet: Secondary | ICD-10-CM | POA: Diagnosis not present

## 2022-11-25 DIAGNOSIS — M25572 Pain in left ankle and joints of left foot: Secondary | ICD-10-CM | POA: Diagnosis not present

## 2022-11-25 NOTE — Therapy (Signed)
OUTPATIENT PHYSICAL THERAPY LOWER EXTREMITY TREATMENT  PHYSICAL THERAPY DISCHARGE SUMMARY  Visits from Start of Care: 5  Current functional level related to goals / functional outcomes: See goals below   Remaining deficits: See impression below    Education / Equipment: See education below    Patient agrees to discharge. Patient goals were partially met. Patient is being discharged due to the patient's request.  Patient Name: Jennifer Gonzalez MRN: [id number] DOB:08/27/1945, 77 y.o., female Today's Date: 11/25/2022  END OF SESSION:  PT End of Session - 11/25/22 1445     Visit Number 5    Number of Visits 12    Date for PT Re-Evaluation 12/22/22    Authorization Type [employer] -    Authorization Time Period 8/21-10/2    Authorization - Visit Number 4    Authorization - Number of Visits 12    Progress Note Due on Visit 10    PT Start Time 1445    PT Stop Time 1527    PT Time Calculation (min) 42 min    Activity Tolerance Patient tolerated treatment well    Behavior During Therapy WFL for tasks assessed/performed                Past Medical History:  Diagnosis Date   Arthritis    Chicken pox    High cholesterol    Hypertension    Seasonal allergies    Past Surgical History:  Procedure Laterality Date   APPENDECTOMY  1969   AUGMENTATION MAMMAPLASTY     CESAREAN SECTION  1969   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    PCP: Copland, Gwenlyn Found, MD   REFERRING PROVIDER: Louann Sjogren, DPM   REFERRING DIAG: [phone] (ICD-10-CM) - Arthritis of both midfeet   THERAPY DIAG:  Pain in right ankle and joints of right foot  Pain in left ankle and joints of left foot  Unsteadiness on feet  Rationale for Evaluation and Treatment: Rehabilitation  ONSET DATE: years ago, worsened in the past  year  SUBJECTIVE:   SUBJECTIVE STATEMENT: Patient reports her feet have continued to feel good and has not noticed any pain in the feet over the past week. She feels her injections were very helpful. She would like today to be her last day and thinks she can continue with her exercises on her own.   PERTINENT HISTORY: OA, HTN, B knee pain PAIN:  Are you having pain?No  PRECAUTIONS: None  RED FLAGS: None   WEIGHT BEARING RESTRICTIONS: No  FALLS:  Has patient fallen in last 6 months? No  LIVING ENVIRONMENT: Lives with: lives with their spouse Lives in: House/apartment Stairs: Yes: Internal: 12 steps; can reach both and External: 6 steps; can reach both Has following equipment at home: None  OCCUPATION: works as a day-trader  PLOF: Independent  PATIENT GOALS: I want to feel comfortable walking  NEXT MD VISIT: none scheduled  OBJECTIVE:   DIAGNOSTIC FINDINGS:  LEFT FOOT - COMPLETE 3+ VIEW   COMPARISON:  None.   FINDINGS: There are mild degenerative changes at the first metatarsophalangeal joint with joint space narrowing and osteophyte formation. There also degenerative changes with joint space narrowing and osteophyte formation of the dorsal midfoot, particularly the first tarsal metatarsal joint. There are small posterior endplate or calcaneal spurs. Soft tissues are within normal limits.   IMPRESSION: 1. No acute  bony abnormality. 2. Mild degenerative changes of the foot.  RIGHT FOOT COMPLETE - 3+ VIEW   COMPARISON:  None.   FINDINGS: No acute fracture or dislocation. There are mild degenerative changes with joint space narrowing and osteophyte formation at the first metatarsophalangeal joint and over the dorsal midfoot as well as first tarsometatarsal junction. There is a small posterior calcaneal spur. Soft tissues are within normal limits.   IMPRESSION: 1. No acute bony abnormality. 2. Mild degenerative changes of the foot.  PATIENT SURVEYS:   LEFS  42 / 80 = 52.5 % 11/25/22: 44/80= 55%  COGNITION: Overall cognitive status: Within functional limits for tasks assessed     SENSATION: WFL   MUSCLE LENGTH: Marked tightness in B gastroc/soleus  POSTURE:  left pes planus  PALPATION: Tender with cuboid mobs B R>L decreased metatarsal  and TMT joint mobility  LOWER EXTREMITY ROM:  A/P ROM Right eval Left eval  Hip flexion    Hip extension    Hip abduction    Hip adduction    Hip internal rotation    Hip external rotation    Knee flexion    Knee extension    Ankle dorsiflexion -5/-1 -4/0  Ankle plantarflexion    Ankle inversion    Ankle eversion     (Blank rows = not tested)  LOWER EXTREMITY MMT: B ankles grossly 4+/5 to 5/5  MMT Right eval Left eval  Hip flexion 4+ 4+  Hip extension 5 5  Hip abduction 4- 5  Hip adduction 4- 5  Hip internal rotation    Hip external rotation    Knee flexion    Knee extension     (Blank rows = not tested)   FUNCTIONAL TESTS:  5 times sit to stand: 15.81 sec pain in knees Timed up and go (TUG): 12.23 sec Dynamic Gait Index: 18/24 MCTSIB: Condition 1: Avg of 3 trials: 30 sec, Condition 2: Avg of 3 trials: 30 sec, Condition 3: Avg of 3 trials: 30 sec, Condition 4: Avg of 3 trials: 30 sec, and Total Score: 120/120 SLS L > 10 sec, R <= 4 sec  11/25/22: 5 x STS: 15.5 seconds  DGI: 21/24  GAIT: Distance walked: 60 Assistive device utilized: None Level of assistance: Complete Independence Comments: deviates from straight path, intermittent LOB   TODAY'S TREATMENT:                                                                                                                              [facility] Adult PT Treatment:                                                DATE: 11/25/22 Therapeutic Exercise: Reviewed and performed HEP discussing frequency, sets, reps, and ways to progress independently.   Therapeutic Activity: Re-assessment of goals,  educating patient on overall  progress.   Self-Care: At home fall risk checklist discussed and provided.     [facility] Adult PT Treatment:                                                DATE: 11/23/22 Therapeutic Exercise: Calf stretch on wedge x 1 minute  Seated arch lifts 2 x 10 each  Seated great toe extension 2 x 10 each  Standing calf raises 2 x 10  Standing toe raises 2 x 10  Standing hip abduction 2 x 10  Standing hip extension 2 x 10  Standing march 2 x 10  Hip bridge 2 x 10  SLR 2 x 10  Sit to stand 2 x 10  Updated HEP    [facility] Adult PT Treatment:                                                DATE: 11/18/2022 Therapeutic Exercise: Slantboard calf stretch x 2 min Staggered stance L foot forward calf stretch x 2 min Standing L hip extension/external rotation isometric against therapist resistance, L foot on slide disc, 3 x 30 sec Manual Therapy: Supine: calcaneal dorsiflexion glides + AP glides at talus Neuromuscular re-ed: Single leg balance on BAPs board, level 1, upper extremity assist on stair rail Single leg counterpressure on BAPs board against therapist resistance into plantarflexion, eversion, and inversion, 3 x 30 sec each   11/16/22 Checked hip strength MCTSIB completed - see objective Sidelying hip ABD 2x10 R Clam x 10 R (easy) - done for assessment Gastroc and soleus stretch 2x30 sec B Gastroc stretch on incline B x 30 sec Seated DF stretch x 10 sec for review; also did soleus briefly Also demonstrated plantar fascia stretch with toes on wall  SLS B no UE support SLS star touches x 5 B to color dots semi circle Seated arch lifts 5 sec hold x 5 Standing arch lifts 5 sec hold x 5  Manual: STM to B plantar fascia, cuboid and metatarsal mobs B  PATIENT EDUCATION:  Education details: d/c education; see treatment  Person educated: Patient Education method: Explanation, Demonstration, and Handouts Education comprehension: verbalized understanding and returned demonstration  HOME  EXERCISE PROGRAM: Access Code: [email or web address] URL: [email or web address] Date: 11/25/2022 Prepared by: Letitia Libra  Exercises - Standing Plantar Fascia Mobilization with Small Ball  - 1 x daily - 7 x weekly - 1 sets - 10 reps - Gastroc Stretch on Wall  - 1 x daily - 7 x weekly - 1 sets - 3 reps - 30-sec  hold - Standing Soleus Stretch  - 1 x daily - 7 x weekly - 3 reps - 30s hold - Arch Stretch  - 1 x daily - 7 x weekly - 1 sets - 3 reps - 30 sec hold - Sidelying Hip Abduction  - 1 x daily - 3 x weekly - 2 sets - 10 reps - Single Leg Balance with Clock Reach  - 1 x daily - 3 x weekly - 1 sets - 10 reps - Single Leg Stance  - 1 x daily - 3 x weekly - 1 sets - max hold - Standing Hip Extension with Counter Support  -  1 x daily - 3 x weekly - 2 sets - 10 reps - Standing Hip Abduction with Counter Support  - 1 x daily - 3 x weekly - 2 sets - 10 reps - Standing March with Counter Support  - 1 x daily - 3 x weekly - 2 sets - 10 reps - Seated Great Toe Extension  - 1 x daily - 3 x weekly - 2 sets - 10 reps - Seated Ankle Plantarflexion with Resistance  - 1 x daily - 3 x weekly - 2 sets - 10 reps - Ankle Dorsiflexion with Resistance  - 1 x daily - 3 x weekly - 2 sets - 10 reps  Patient Education - Falls at Home Checklist  ASSESSMENT:  CLINICAL IMPRESSION: Jennifer Gonzalez has completed 5 PT sessions reporting an overall improvement in her bilateral foot pain stating she has not experienced much pain since her recent injections. She has met all short term functional goals and 4/6 long term functional goals. She has requested to be discharged today as she feels she can continue with her HEP independently. We spent time performing and discussing advanced HEP with patient demonstrating independence. She is therefore appropriate for discharge with patient in agreement with this plan.    OBJECTIVE IMPAIRMENTS: Abnormal gait, decreased balance, decreased ROM, decreased strength, hypomobility, impaired  flexibility, postural dysfunction, and pain.   ACTIVITY LIMITATIONS: squatting, transfers, and locomotion level  PARTICIPATION LIMITATIONS: cleaning, shopping, community activity, and yard work  PERSONAL FACTORS: Age, Time since onset of injury/illness/exacerbation, and 1 comorbidity: B knee pain  are also affecting patient's functional outcome.   REHAB POTENTIAL: Good  CLINICAL DECISION MAKING: Evolving/moderate complexity  EVALUATION COMPLEXITY: Moderate   GOALS: Goals reviewed with patient? Yes  SHORT TERM GOALS: Target date: 11/24/2022   Patient will be independent with initial HEP. Baseline:  Goal status: MET  2.  Patient will be educated on strategies to decrease risk of falls.  Baseline:  11/25/22: discussed strategies to decrease fall risk  Goal status: MET   LONG TERM GOALS: Target date: 12/22/2022  Patient will be independent with advanced/ongoing HEP to improve outcomes and carryover.  Baseline:  Goal status: MET  2.  Patient will be able to ambulate 600' with good safety and no increase in foot pain to access community.  Baseline:  11/25/22: has been able to walk in [employer] without issues  Goal status: MET  3.  Patient to report decreased foot pain by 75% with walking to improve QOL. Baseline:  11/25/22: reports 80% improvement  Goal status: MET   4.  Patient will demonstrate improved functional LE strength as demonstrated by decreased 5xSTS time to = 12.6 sec (age norm). Baseline:  Goal status: Not met  5.  Patient will demonstrate at least 19/24 on DGI to improve gait stability and reduce risk for falls. Baseline: 18 Goal status: MET  6.  Patient will score 51/80 on LEFS showing functional improvement. Baseline: 42 / 80 = 52.5 % Goal status: Not met      PLAN:  PT FREQUENCY: 2x/week  PT DURATION: 6 weeks  PLANNED INTERVENTIONS: Therapeutic exercises, Therapeutic activity, Neuromuscular re-education, Balance training, Gait training,  Patient/Family education, Self Care, Joint mobilization, Aquatic Therapy, Dry Needling, Electrical stimulation, Spinal mobilization, Cryotherapy, Moist heat, Taping, Ultrasound, and Manual therapy  PLAN FOR NEXT SESSION:n/a d/c     Letitia Libra, PT, DPT, ATC 11/25/22 4:24 PM

## 2022-12-08 DIAGNOSIS — Z0100 Encounter for examination of eyes and vision without abnormal findings: Secondary | ICD-10-CM | POA: Diagnosis not present

## 2022-12-10 ENCOUNTER — Ambulatory Visit: Payer: Medicare HMO | Admitting: Podiatry

## 2022-12-10 ENCOUNTER — Encounter: Payer: Self-pay | Admitting: Podiatry

## 2022-12-10 DIAGNOSIS — M19071 Primary osteoarthritis, right ankle and foot: Secondary | ICD-10-CM

## 2022-12-10 DIAGNOSIS — M19072 Primary osteoarthritis, left ankle and foot: Secondary | ICD-10-CM

## 2022-12-10 MED ORDER — TRIAMCINOLONE ACETONIDE 10 MG/ML IJ SUSP
2.5000 mg | Freq: Once | INTRAMUSCULAR | Status: AC
  Administered 2022-12-10: 2.5 mg via INTRA_ARTICULAR

## 2022-12-10 MED ORDER — DEXAMETHASONE SODIUM PHOSPHATE 120 MG/30ML IJ SOLN
4.0000 mg | Freq: Once | INTRAMUSCULAR | Status: AC
  Administered 2022-12-10: 4 mg via INTRA_ARTICULAR

## 2022-12-10 NOTE — Progress Notes (Signed)
Subjective:  Patient ID: Jennifer Gonzalez, female    DOB: 1945/12/06,   MRN: [id number]  No chief complaint on file.   77 y.o. female presents for follow-up of bilateral midfoot arthritis. Relates the last injection did help but wore off recently relates she is hoping for another as she has a wedding to attend coming up. Denies any other pedal complaints. Denies n/v/f/c.   Past Medical History:  Diagnosis Date   Arthritis    Chicken pox    High cholesterol    Hypertension    Seasonal allergies     Objective:  Physical Exam: Vascular: DP/PT pulses 2/4 bilateral. CFT <3 seconds. Normal hair growth on digits. No edema.  Skin. No lacerations or abrasions bilateral feet.  Musculoskeletal: MMT 5/5 bilateral lower extremities in DF, PF, Inversion and Eversion. Deceased ROM in DF of ankle joint. Tender over dorsum of foot across all TMTJ. Minimal ROM of the rays and minimal pain.  Neurological: Sensation intact to light touch.   Assessment:   1. Arthritis of both midfeet        Plan:  Patient was evaluated and treated and all questions answered. X-rays reviewed and discussed with patient. No acute fractures or dislocations noted.  Significant degenerative changes noted throughout midfoot bilateral. Spurring noted to posterior heel bilateral. Varus deformity noted to lesser digits.  Discussed midfoot arthritis with patient and treatment options.  Discussed NSAIDS, topicals, and possible injections.  Injection offered today.  Procedure below.  Discussed PT to help with gait issues Continue PT  Discussed stiff soled shoes and carbon fiber foot plate.  Discussed if pain does not improve can discuss surgical options.  Patient to follow-up as needed.    Procedure: Injection Tendon/Ligament Discussed alternatives, risks, complications and verbal consent was obtained.  Location: Bilateral second tarsometatarsal joint. Skin Prep: Alcohol. Injectate: 1cc 0.5% marcaine plain, 1 cc  dexamethasone 0.5 cc kenalog  Disposition: Patient tolerated procedure well. Injection site dressed with a band-aid.  Post-injection care was discussed and return precautions discussed.     Louann Sjogren, DPM

## 2022-12-18 ENCOUNTER — Other Ambulatory Visit: Payer: Self-pay | Admitting: Family Medicine

## 2022-12-18 DIAGNOSIS — I10 Essential (primary) hypertension: Secondary | ICD-10-CM

## 2023-01-16 ENCOUNTER — Other Ambulatory Visit: Payer: Self-pay | Admitting: Family Medicine

## 2023-01-16 DIAGNOSIS — I10 Essential (primary) hypertension: Secondary | ICD-10-CM

## 2023-01-19 IMAGING — DX DG FOOT COMPLETE 3+V*R*
3 series · 3 of 3 positions shown · non-contrast
Comparison: None.

CLINICAL DATA: Foot pain.

EXAM:
RIGHT FOOT COMPLETE - 3+ VIEW

[foot ap wb]
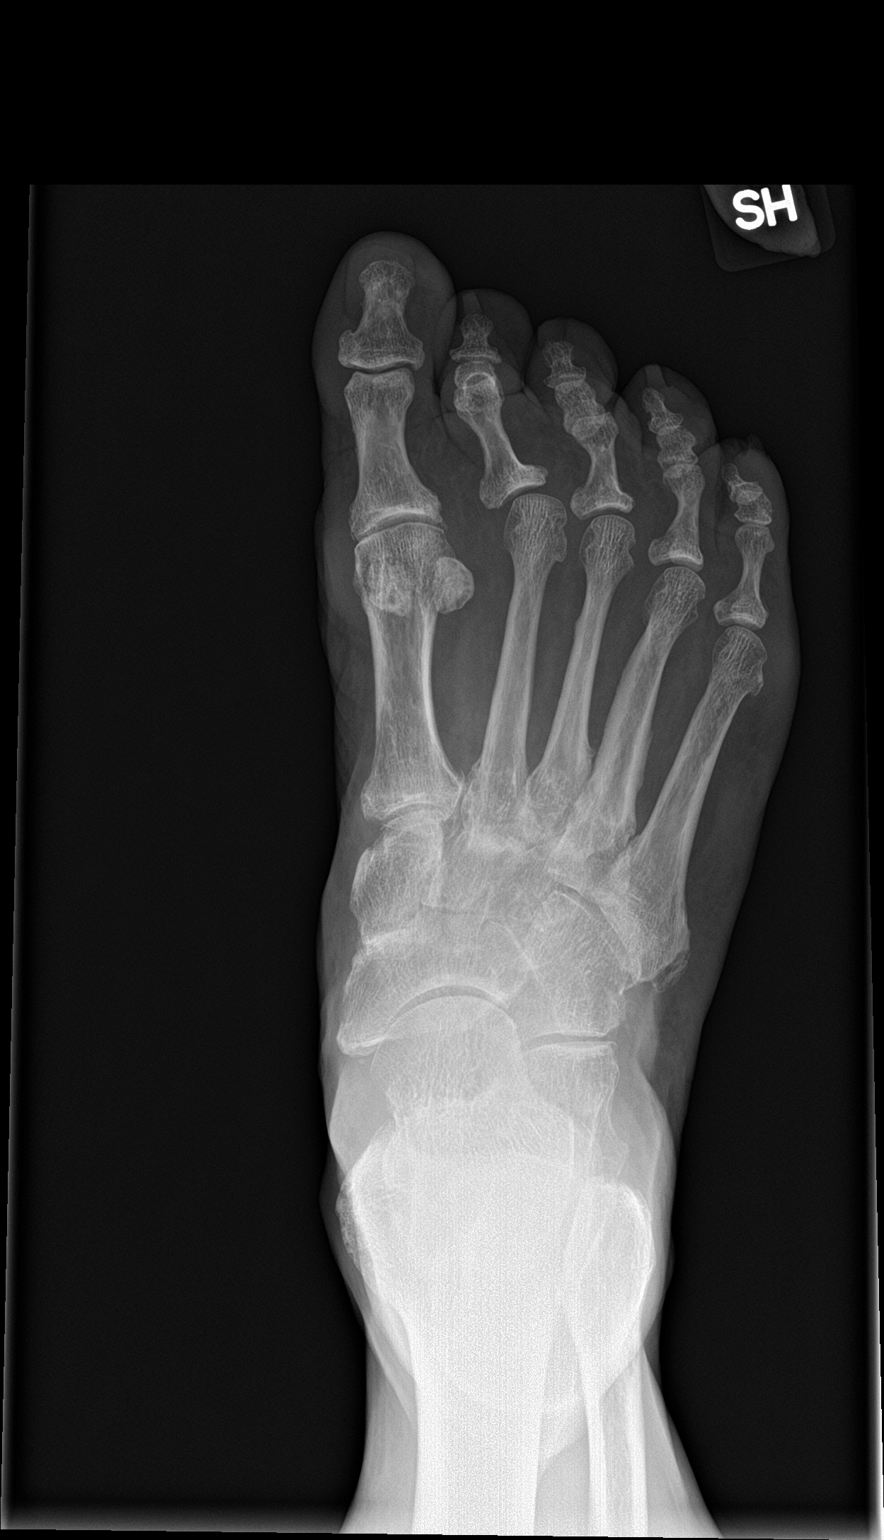

[foot obl wb]
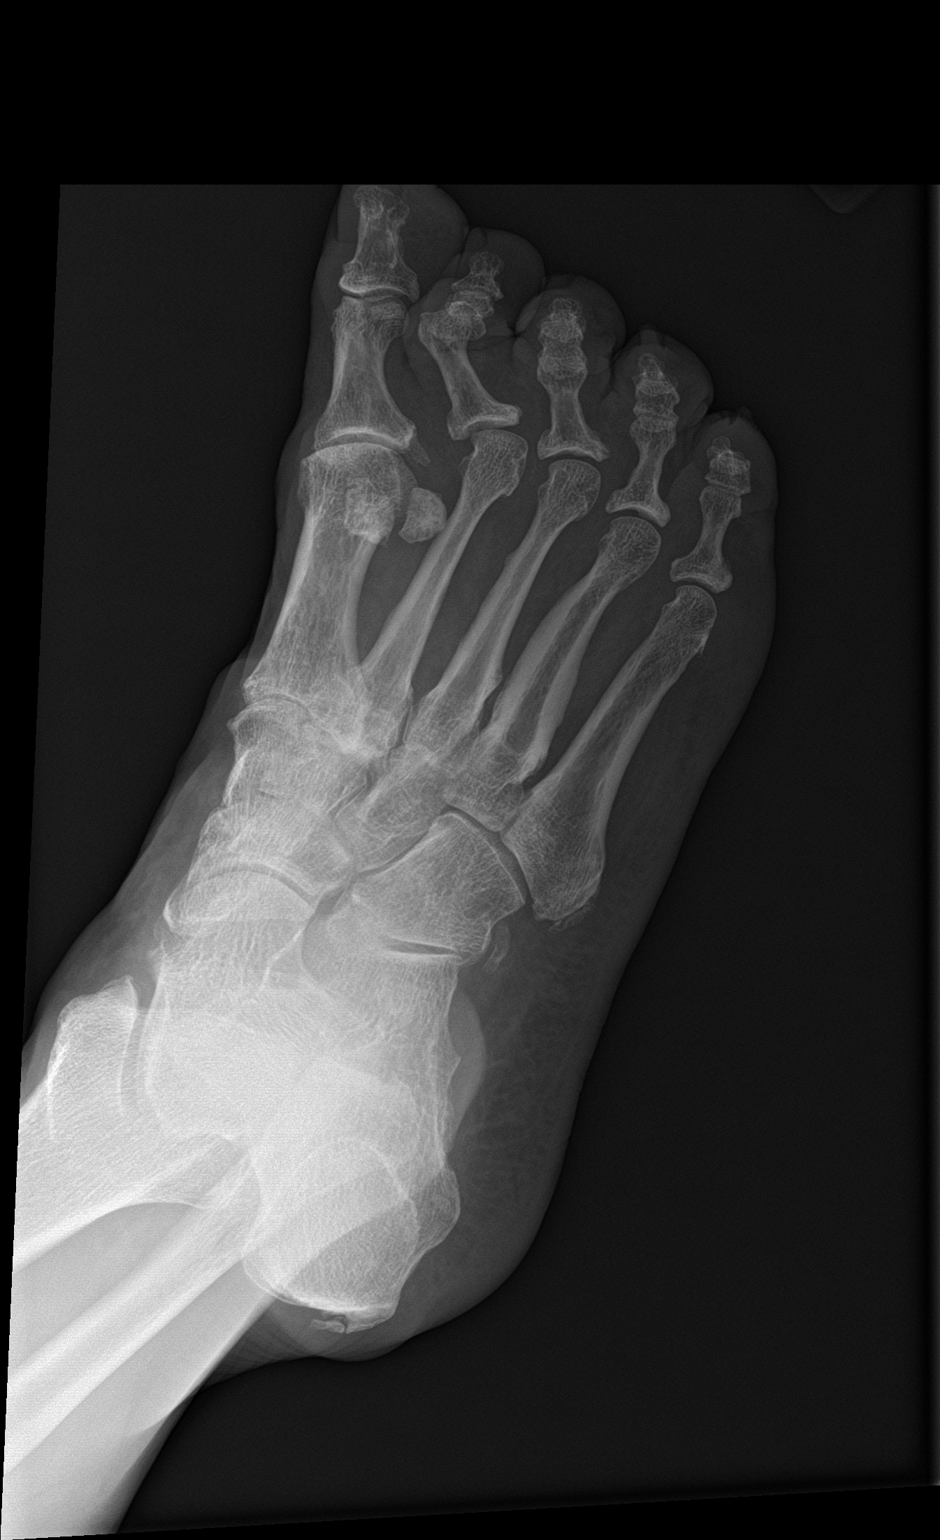

[foot lat wb]
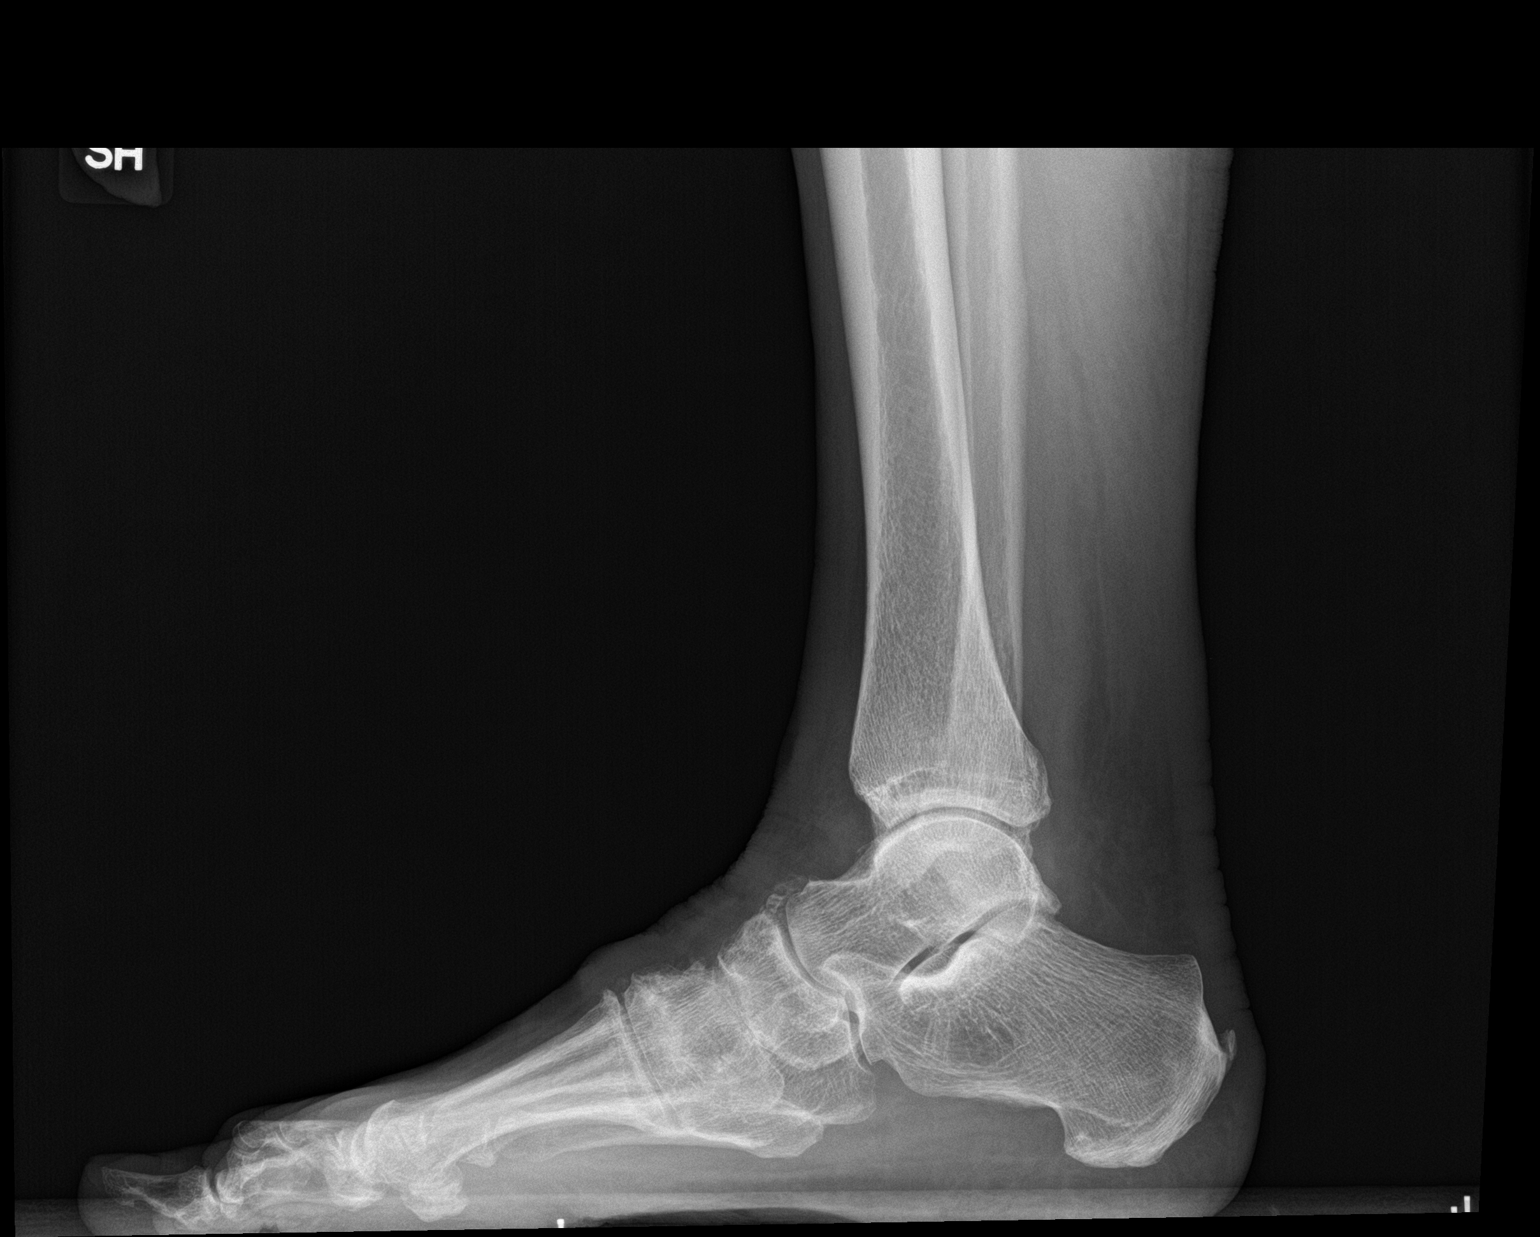

[3 of 3 positions shown; findings below may reference images not displayed]

FINDINGS: No acute fracture or dislocation. There are mild degenerative
changes with joint space narrowing and osteophyte formation at the
first metatarsophalangeal joint and over the dorsal midfoot as well
as first tarsometatarsal junction. There is a small posterior
calcaneal spur. Soft tissues are within normal limits.
IMPRESSION: 1. No acute bony abnormality.
2. Mild degenerative changes of the foot.

## 2023-01-19 IMAGING — DX DG FOOT COMPLETE 3+V*L*
3 series · 3 of 3 positions shown · non-contrast
Comparison: None.

CLINICAL DATA: Foot pain.

EXAM:
LEFT FOOT - COMPLETE 3+ VIEW

[foot ap wb]
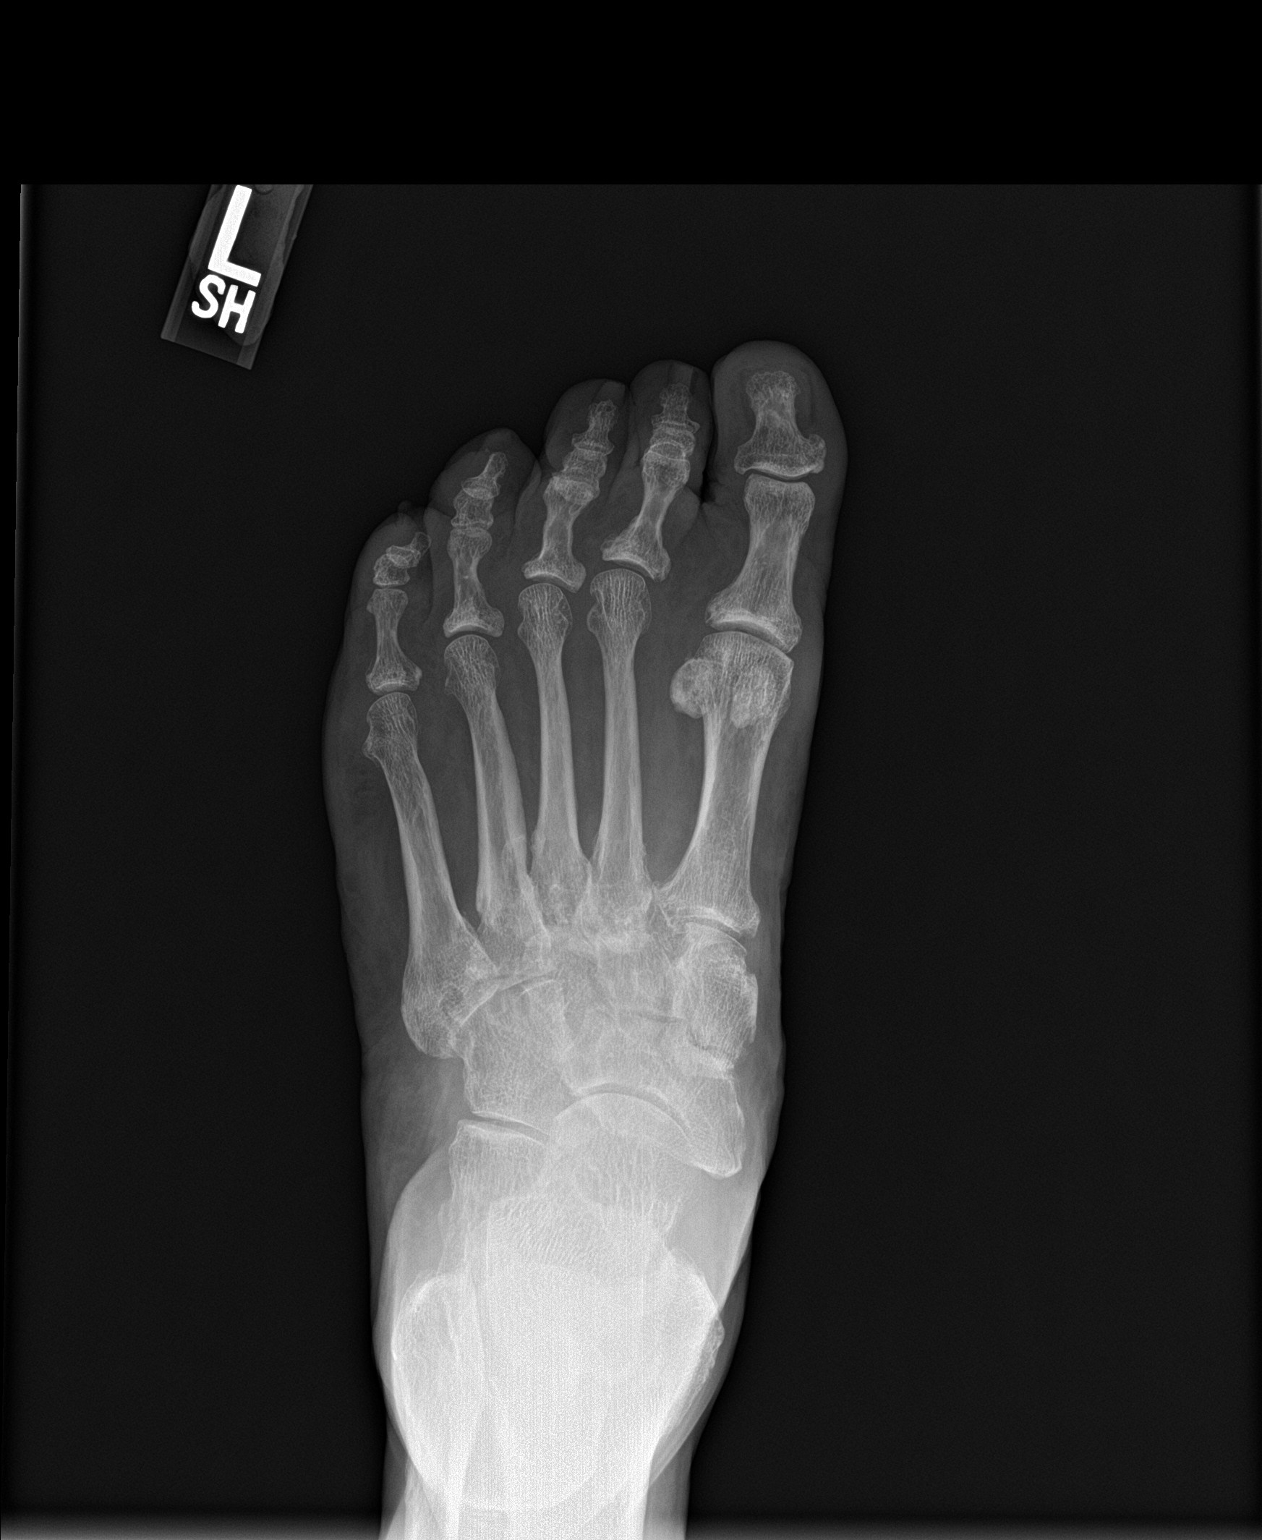

[foot obl wb]
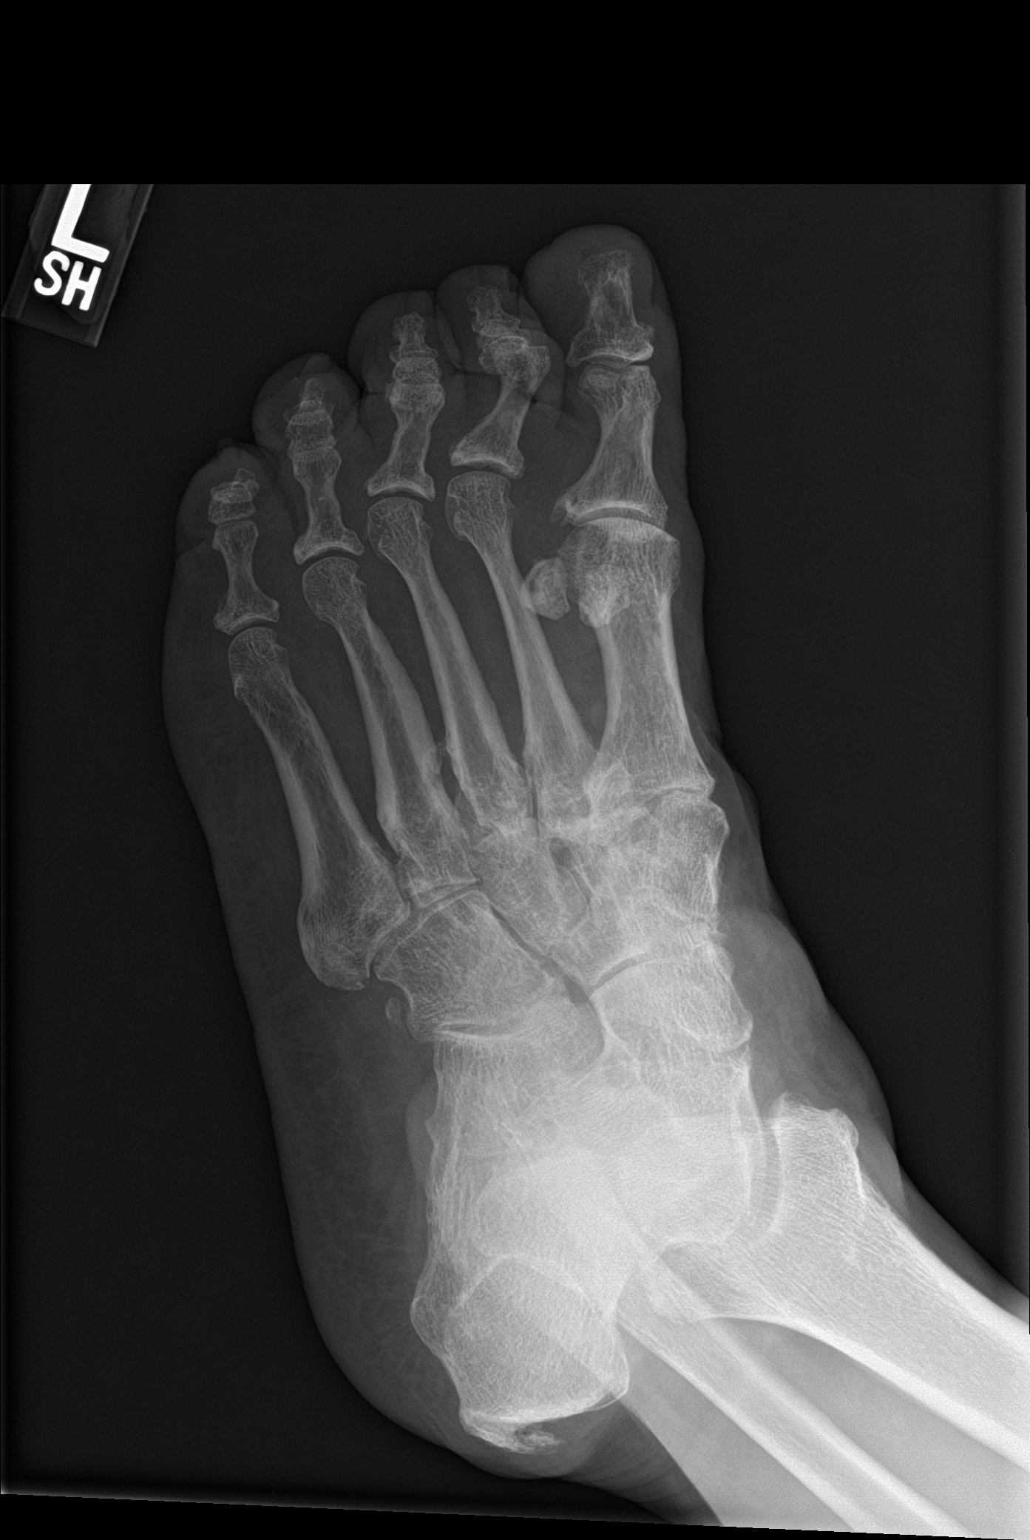

[foot lat wb]
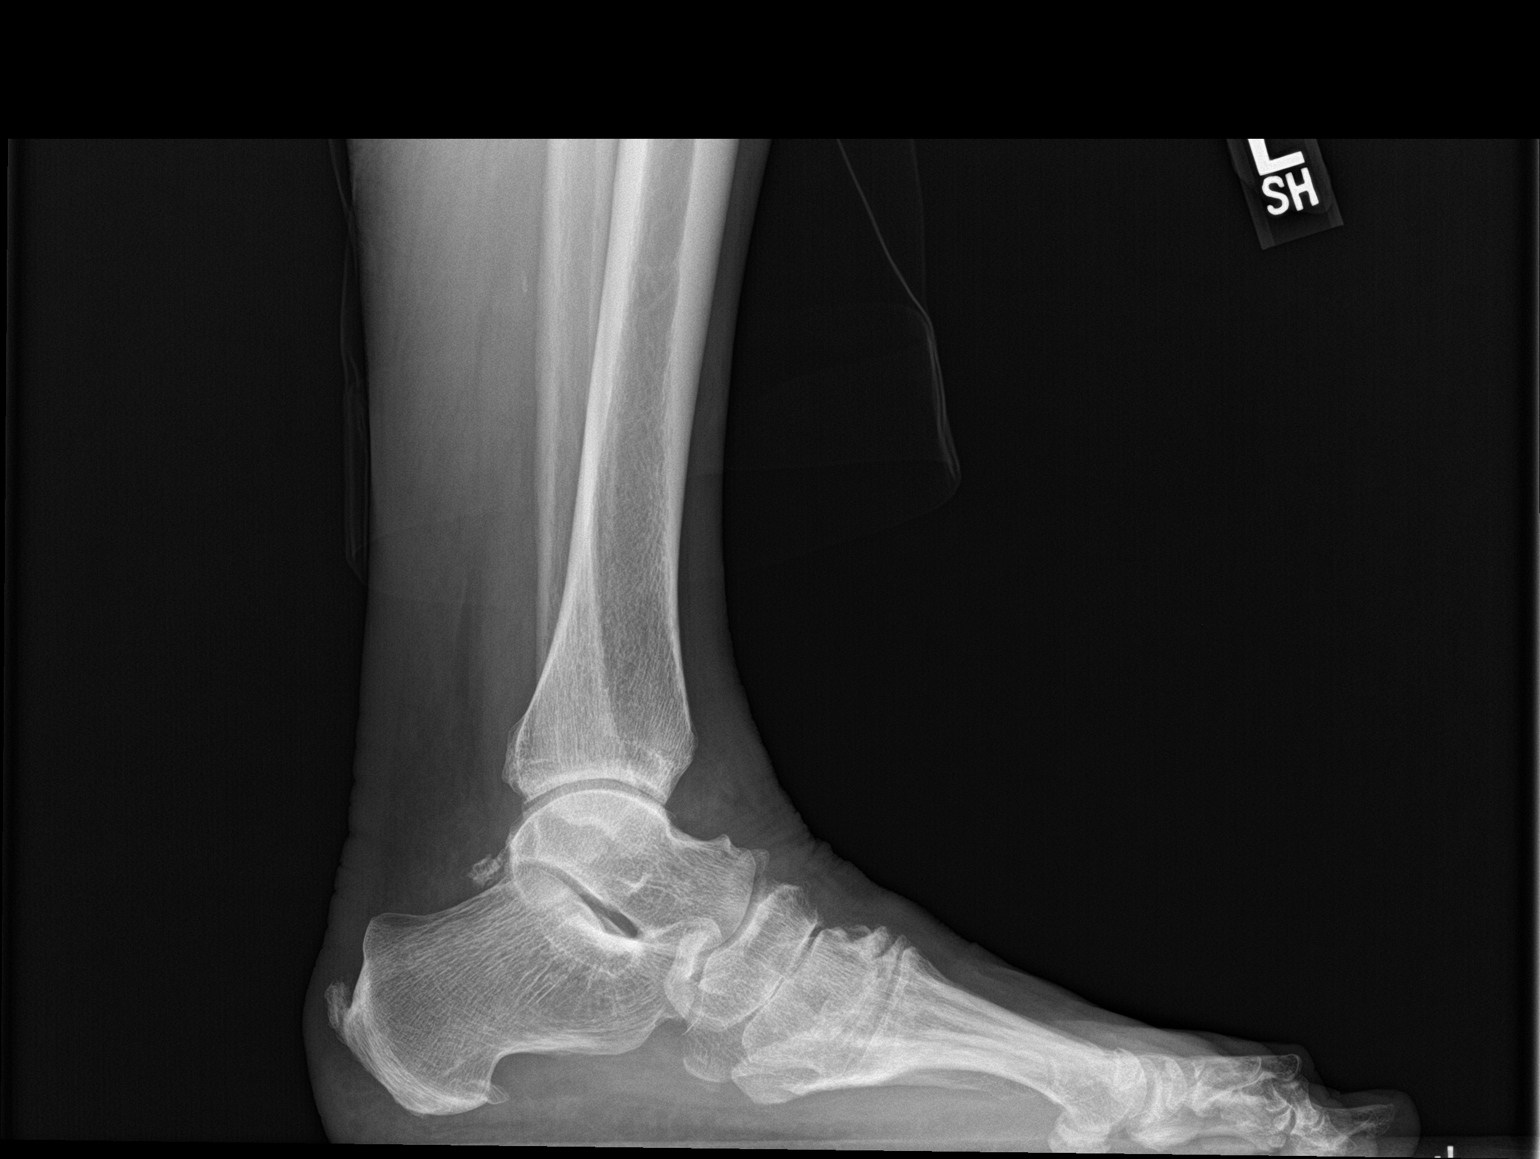

[3 of 3 positions shown; findings below may reference images not displayed]

FINDINGS: There are mild degenerative changes at the first metatarsophalangeal
joint with joint space narrowing and osteophyte formation. There
also degenerative changes with joint space narrowing and osteophyte
formation of the dorsal midfoot, particularly the first tarsal
metatarsal joint. There are small posterior endplate or calcaneal
spurs. Soft tissues are within normal limits.
IMPRESSION: 1. No acute bony abnormality.
2. Mild degenerative changes of the foot.

## 2023-02-08 ENCOUNTER — Telehealth: Payer: Self-pay | Admitting: Family Medicine

## 2023-02-08 NOTE — Telephone Encounter (Signed)
Pt is wanting to know if ok to [facility] from [place] to [employer], pt schedule an appt for tomorrow with Ramon Dredge as New pt,  but wanting to if ok to toc. Please advise. Pt tel [phone]

## 2023-02-09 ENCOUNTER — Ambulatory Visit: Payer: Medicare HMO | Admitting: Medical

## 2023-03-04 ENCOUNTER — Encounter: Payer: Self-pay | Admitting: Family Medicine

## 2023-03-04 DIAGNOSIS — I10 Essential (primary) hypertension: Secondary | ICD-10-CM

## 2023-03-04 NOTE — Telephone Encounter (Signed)
Okay with Rx refill?

## 2023-03-05 MED ORDER — LOSARTAN POTASSIUM-HCTZ 100-25 MG PO TABS: 1.0000 | ORAL_TABLET | Freq: Every day | ORAL | 0 refills | Status: DC

## 2023-03-07 MED ORDER — LOSARTAN POTASSIUM-HCTZ 100-25 MG PO TABS: 1.0000 | ORAL_TABLET | Freq: Every day | ORAL | 1 refills | Status: DC

## 2023-03-07 NOTE — Telephone Encounter (Signed)
Pt called back to follow up on med refill status. Per TOC appt availability, pt had to be rescheduled for 2.18.25. Ok to refill meds until then?

## 2023-03-07 NOTE — Telephone Encounter (Signed)
You originally sent rx for 1 month on 03/05/23. Are you okay with sending a refill? Are you able to do that since she is transferring care?

## 2023-03-07 NOTE — Addendum Note (Signed)
Addended by: Abbe Amsterdam C on: 03/07/2023 12:03 PM   Modules accepted: Orders

## 2023-04-05 ENCOUNTER — Encounter: Payer: Medicare HMO | Admitting: Medical

## 2023-04-13 ENCOUNTER — Other Ambulatory Visit: Payer: Self-pay | Admitting: Family Medicine

## 2023-04-13 DIAGNOSIS — I10 Essential (primary) hypertension: Secondary | ICD-10-CM

## 2023-04-13 MED ORDER — LOSARTAN POTASSIUM-HCTZ 100-25 MG PO TABS: 1.0000 | ORAL_TABLET | Freq: Every day | ORAL | 1 refills | Status: DC

## 2023-04-13 NOTE — Telephone Encounter (Signed)
Copied from CRM [id number]. Topic: Clinical - Medication Refill >> Apr 13, 2023  2:15 PM Kathryne Eriksson wrote: Most Recent Primary Care Visit:  Provider: Juel Burrow  Department: [facility]  Visit Type: MEDICARE AWV, SEQUENTIAL  Date: 05/05/2022  Medication: losartan-hydrochlorothiazide (HYZAAR) 100-25 MG tablet  Has the patient contacted their pharmacy? Yes (Agent: If no, request that the patient contact the pharmacy for the refill. If patient does not wish to contact the pharmacy document the reason why and proceed with request.) (Agent: If yes, when and what did the pharmacy advise?)  Is this the correct pharmacy for this prescription? Yes If no, delete pharmacy and type the correct one.  This is the patient's preferred pharmacy:  [id number] - [place] - [address] [place] [address] Phone: [phone] Fax: [phone]   Has the prescription been filled recently? Yes  Is the patient out of the medication? Yes  Has the patient been seen for an appointment in the last year OR does the patient have an upcoming appointment? Yes  Can we respond through MyChart? Yes  Agent: Please be advised that Rx refills may take up to 3 business days. We ask that you follow-up with your pharmacy.

## 2023-05-10 ENCOUNTER — Encounter: Payer: Medicare HMO | Admitting: Medical

## 2023-05-11 ENCOUNTER — Telehealth (INDEPENDENT_AMBULATORY_CARE_PROVIDER_SITE_OTHER): Payer: Medicare HMO | Admitting: Family Medicine

## 2023-05-11 DIAGNOSIS — I10 Essential (primary) hypertension: Secondary | ICD-10-CM

## 2023-05-11 DIAGNOSIS — H02729 Madarosis of unspecified eye, unspecified eyelid and periocular area: Secondary | ICD-10-CM | POA: Diagnosis not present

## 2023-05-11 MED ORDER — BIMATOPROST 0.03 % EX SOLN
CUTANEOUS | 1 refills | Status: AC
Start: 2023-05-11 — End: ?

## 2023-05-11 MED ORDER — LOSARTAN POTASSIUM-HCTZ 100-25 MG PO TABS: 1.0000 | ORAL_TABLET | Freq: Every day | ORAL | 1 refills | Status: DC

## 2023-05-11 NOTE — Progress Notes (Signed)
[facility] [address], [address] [place], [place] [address] [phone]  Date:  05/11/2023   Name:  Jennifer Gonzalez   DOB:  August 25, 1945   MRN:  [id number]  PCP:  Pearline Cables, MD    Chief Complaint: Medication Refill   History of Present Illness:  Jennifer Gonzalez is a 78 y.o. very pleasant female patient who presents with the following:  Virtual visit today for recheck due to weather.  Connected with patient via MyChart video.  However audio did not work with video so we switched to phone call only Patient had any confirmed with 2 factors, she gives consent for virtual visit today.  The patient and myself are present on the call today Last seen by myself in 2023  Pt is planning to change over to [facility] for primary care but her establish care appt is not until May Patient notes her appointment has been rescheduled a few times She does not ever check her BP  She notes she is feeling fine  She is well overdue for labs and visit.    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    Past Medical History:  Diagnosis Date   Arthritis    Chicken pox    High cholesterol    Hypertension    Seasonal allergies     Past Surgical History:  Procedure Laterality Date   APPENDECTOMY  1969   AUGMENTATION MAMMAPLASTY     CESAREAN SECTION  1969    Social History   Tobacco Use   Smoking status: Never   Smokeless tobacco: Never  Vaping Use   Vaping status: Never Used  Substance Use Topics   Alcohol use: Yes    Comment: daily   Drug use: No    Family History  Problem Relation Age of Onset   Cancer Mother    Early death Mother    Hypertension Mother    Alcohol abuse Father    Arthritis Father    Asthma Father    COPD Father    Heart disease Father    Hypertension Father     Asthma Daughter     Allergies  Allergen Reactions   Amlodipine     Dizziness    Aspirin Tinitus   Citrullus Vulgaris Itching   Clindamycin Other (See Comments)    Esophageal inflammation     Codeine Other (See Comments)    Hallucinations     Diphenhydramine Hcl (Sleep) Other (See Comments)    unknown unknown    Loratadine Other (See Comments)    unknown    Metronidazole Itching   Penicillins Itching   Pineapple Itching   Tramadol Other (See Comments)    woozie feeling     Medication list has been reviewed and updated.  Current Outpatient Medications on File Prior to Visit  Medication Sig Dispense Refill   acetaminophen (TYLENOL) 650 MG CR tablet Take 650 mg by mouth 2 (two) times daily as needed for pain.     bimatoprost (LATISSE) 0.03 % ophthalmic solution Apply 1 drop to upper eyelids once daily 3 mL 12   chlorpheniramine (CHLOR-TRIMETON) 4 MG tablet Take 1 tablet by mouth 2 (two) times daily.     losartan-hydrochlorothiazide (HYZAAR) 100-25 MG tablet Take 1 tablet by mouth daily. 30 tablet 1   No current facility-administered  medications on file prior to visit.    Review of Systems:  As per HPI- otherwise negative.   Physical Examination: Vitals:   Vitals:   There is no height or weight on file to calculate BMI. Ideal Body Weight:    Observed her MyChart video, looks well  Assessment and Plan: Essential hypertension - Plan: losartan-hydrochlorothiazide (HYZAAR) 100-25 MG tablet  Loss of eyelashes, unspecified laterality - Plan: bimatoprost (LATISSE) 0.03 % ophthalmic solution  Connected via video and observed patient, then changed over to phone only as pt could not get her video to work I did refill her BP meds but advised I would encourage her to be seen sooner than May.  I will send a message to Ramon Dredge and see if he can see her any sooner Signed Abbe Amsterdam, MD

## 2023-05-16 ENCOUNTER — Telehealth: Payer: Self-pay

## 2023-05-16 NOTE — Telephone Encounter (Signed)
-----   Message from [facility] sent at 05/12/2023  8:41 AM EST ----- Ok. Did not realize she was internal transfer. Will you offer appointment next Wednesday, Thursday or Friday. ----- Message ----- From: Pearline Cables, MD Sent: 05/11/2023   1:47 PM EST To: [facility]ters, PA-C  Hi Edward, this patient wants to switch over to you for primary care and I think you have accepted her.  However she has not been seen since 2023 and her new patient appointment is not until May.  She says she has gotten rescheduled couple of times.  I did not know if you guys could get her seen a bit more quickly to catch up on labs etc.

## 2023-05-16 NOTE — Telephone Encounter (Signed)
Can you get pt scheduled 

## 2023-05-19 ENCOUNTER — Telehealth: Payer: Self-pay

## 2023-05-19 NOTE — Telephone Encounter (Signed)
 Unsuccessful attempts to reach patient on preferred number listed in notes for scheduled AWV. Left message on voicemail okay to reschedule.

## 2023-05-24 ENCOUNTER — Ambulatory Visit (INDEPENDENT_AMBULATORY_CARE_PROVIDER_SITE_OTHER): Payer: Medicare HMO | Admitting: *Deleted

## 2023-05-24 DIAGNOSIS — Z0000 Encounter for general adult medical examination without abnormal findings: Secondary | ICD-10-CM | POA: Diagnosis not present

## 2023-05-24 NOTE — Patient Instructions (Signed)
 Ms. Jobin , Thank you for taking time to come for your Medicare Wellness Visit. I appreciate your ongoing commitment to your health goals. Please review the following plan we discussed and let me know if I can assist you in the future.     This is a list of the screening recommended for you and due dates:  Health Maintenance  Topic Date Due   DTaP/Tdap/Td vaccine (1 - Tdap) Never done   Zoster (Shingles) Vaccine (1 of 2) Never done   COVID-19 Vaccine (2 - 2024-25 season) 11/21/2022   Medicare Annual Wellness Visit  05/23/2024   Pneumonia Vaccine  Completed   DEXA scan (bone density measurement)  Completed   Hepatitis C Screening  Completed   HPV Vaccine  Aged Out   Flu Shot  Discontinued    Next appointment: Follow up in one year for your annual wellness visit.   Preventive Care 60 Years and Older, Female Preventive care refers to lifestyle choices and visits with your health care provider that can promote health and wellness. What does preventive care include? A yearly physical exam. This is also called an annual well check. Dental exams once or twice a year. Routine eye exams. Ask your health care provider how often you should have your eyes checked. Personal lifestyle choices, including: Daily care of your teeth and gums. Regular physical activity. Eating a healthy diet. Avoiding tobacco and drug use. Limiting alcohol use. Practicing safe sex. Taking low-dose aspirin every day. Taking vitamin and mineral supplements as recommended by your health care provider. What happens during an annual well check? The services and screenings done by your health care provider during your annual well check will depend on your age, overall health, lifestyle risk factors, and family history of disease. Counseling  Your health care provider may ask you questions about your: Alcohol use. Tobacco use. Drug use. Emotional well-being. Home and relationship well-being. Sexual  activity. Eating habits. History of falls. Memory and ability to understand (cognition). Work and work Astronomer. Reproductive health. Screening  You may have the following tests or measurements: Height, weight, and BMI. Blood pressure. Lipid and cholesterol levels. These may be checked every 5 years, or more frequently if you are over 80 years old. Skin check. Lung cancer screening. You may have this screening every year starting at age 36 if you have a 30-pack-year history of smoking and currently smoke or have quit within the past 15 years. Fecal occult blood test (FOBT) of the stool. You may have this test every year starting at age 8. Flexible sigmoidoscopy or colonoscopy. You may have a sigmoidoscopy every 5 years or a colonoscopy every 10 years starting at age 35. Hepatitis C blood test. Hepatitis B blood test. Sexually transmitted disease (STD) testing. Diabetes screening. This is done by checking your blood sugar (glucose) after you have not eaten for a while (fasting). You may have this done every 1-3 years. Bone density scan. This is done to screen for osteoporosis. You may have this done starting at age 81. Mammogram. This may be done every 1-2 years. Talk to your health care provider about how often you should have regular mammograms. Talk with your health care provider about your test results, treatment options, and if necessary, the need for more tests. Vaccines  Your health care provider may recommend certain vaccines, such as: Influenza vaccine. This is recommended every year. Tetanus, diphtheria, and acellular pertussis (Tdap, Td) vaccine. You may need a Td booster every 10 years. Zoster  vaccine. You may need this after age 84. Pneumococcal 13-valent conjugate (PCV13) vaccine. One dose is recommended after age 21. Pneumococcal polysaccharide (PPSV23) vaccine. One dose is recommended after age 85. Talk to your health care provider about which screenings and vaccines  you need and how often you need them. This information is not intended to replace advice given to you by your health care provider. Make sure you discuss any questions you have with your health care provider. Document Released: 04/04/2015 Document Revised: 11/26/2015 Document Reviewed: 01/07/2015 Elsevier Interactive Patient Education  2017 [employer].  Fall Prevention in the Home Falls can cause injuries. They can happen to people of all ages. There are many things you can do to make your home safe and to help prevent falls. What can I do on the outside of my home? Regularly fix the edges of walkways and driveways and fix any cracks. Remove anything that might make you trip as you walk through a door, such as a raised step or threshold. Trim any bushes or trees on the path to your home. Use bright outdoor lighting. Clear any walking paths of anything that might make someone trip, such as rocks or tools. Regularly check to see if handrails are loose or broken. Make sure that both sides of any steps have handrails. Any raised decks and porches should have guardrails on the edges. Have any leaves, snow, or ice cleared regularly. Use sand or salt on walking paths during winter. Clean up any spills in your garage right away. This includes oil or grease spills. What can I do in the bathroom? Use night lights. Install grab bars by the toilet and in the tub and shower. Do not use towel bars as grab bars. Use non-skid mats or decals in the tub or shower. If you need to sit down in the shower, use a plastic, non-slip stool. Keep the floor dry. Clean up any water that spills on the floor as soon as it happens. Remove soap buildup in the tub or shower regularly. Attach bath mats securely with double-sided non-slip rug tape. Do not have throw rugs and other things on the floor that can make you trip. What can I do in the bedroom? Use night lights. Make sure that you have a light by your bed that  is easy to reach. Do not use any sheets or blankets that are too big for your bed. They should not hang down onto the floor. Have a firm chair that has side arms. You can use this for support while you get dressed. Do not have throw rugs and other things on the floor that can make you trip. What can I do in the kitchen? Clean up any spills right away. Avoid walking on wet floors. Keep items that you use a lot in easy-to-reach places. If you need to reach something above you, use a strong step stool that has a grab bar. Keep electrical cords out of the way. Do not use floor polish or wax that makes floors slippery. If you must use wax, use non-skid floor wax. Do not have throw rugs and other things on the floor that can make you trip. What can I do with my stairs? Do not leave any items on the stairs. Make sure that there are handrails on both sides of the stairs and use them. Fix handrails that are broken or loose. Make sure that handrails are as long as the stairways. Check any carpeting to make sure that it  is firmly attached to the stairs. Fix any carpet that is loose or worn. Avoid having throw rugs at the top or bottom of the stairs. If you do have throw rugs, attach them to the floor with carpet tape. Make sure that you have a light switch at the top of the stairs and the bottom of the stairs. If you do not have them, ask someone to add them for you. What else can I do to help prevent falls? Wear shoes that: Do not have high heels. Have rubber bottoms. Are comfortable and fit you well. Are closed at the toe. Do not wear sandals. If you use a stepladder: Make sure that it is fully opened. Do not climb a closed stepladder. Make sure that both sides of the stepladder are locked into place. Ask someone to hold it for you, if possible. Clearly mark and make sure that you can see: Any grab bars or handrails. First and last steps. Where the edge of each step is. Use tools that help you  move around (mobility aids) if they are needed. These include: Canes. Walkers. Scooters. Crutches. Turn on the lights when you go into a dark area. Replace any light bulbs as soon as they burn out. Set up your furniture so you have a clear path. Avoid moving your furniture around. If any of your floors are uneven, fix them. If there are any pets around you, be aware of where they are. Review your medicines with your doctor. Some medicines can make you feel dizzy. This can increase your chance of falling. Ask your doctor what other things that you can do to help prevent falls. This information is not intended to replace advice given to you by your health care provider. Make sure you discuss any questions you have with your health care provider. Document Released: 01/02/2009 Document Revised: 08/14/2015 Document Reviewed: 04/12/2014 Elsevier Interactive Patient Education  2017 [employer].

## 2023-05-24 NOTE — Progress Notes (Signed)
 Subjective:   Jennifer Gonzalez is a 78 y.o. female who presents for Medicare Annual (Subsequent) preventive examination.  Visit Complete: Virtual I connected with  Jennifer Gonzalez on 05/24/23 by a audio enabled telemedicine application and verified that I am speaking with the correct person using two identifiers.  Patient Location: Home  Provider Location: Office/Clinic  I discussed the limitations of evaluation and management by telemedicine. The patient expressed understanding and agreed to proceed.  Vital Signs: Because this visit was a virtual/telehealth visit, some criteria may be missing or patient reported. Any vitals not documented were not able to be obtained and vitals that have been documented are patient reported.  Cardiac Risk Factors include: advanced age (>25men, >50 women);dyslipidemia;hypertension     Objective:    Today's Vitals   05/24/23 1503  PainSc: 5    There is no height or weight on file to calculate BMI.     05/24/2023    3:03 PM 11/10/2022    2:48 PM 05/05/2022    1:45 PM 01/26/2021    2:27 PM 04/23/2019    2:07 PM  Advanced Directives  Does Patient Have a Medical Advance Directive? Yes Yes No Yes Yes  Type of Estate agent of [place];Living will Healthcare Power of [place];Living will  Healthcare Power of [place];Living will Healthcare Power of Attorney  Does patient want to make changes to medical advance directive? No - Patient declined No - Patient declined     Copy of Healthcare Power of Attorney in Chart? No - copy requested No - copy requested  No - copy requested   Would patient like information on creating a medical advance directive?   No - Patient declined      Current Medications (verified) Outpatient Encounter Medications as of 05/24/2023  Medication Sig   AMBULATORY NON FORMULARY MEDICATION Medication Name: Propidren (Hair growth supplement and DHT blocker   ascorbic acid (VITAMIN C) 1000 MG tablet Take 1,000 mg by mouth  daily.   Collagen-Vitamin C-Biotin (COLLAGEN PO) Take 11 g by mouth daily.   Cyanocobalamin (B-12 PO) Take 1,500 mcg by mouth daily.   Multiple Vitamin (MULTIVITAMIN) capsule Take 1 capsule by mouth daily.   acetaminophen (TYLENOL) 650 MG CR tablet Take 650 mg by mouth 2 (two) times daily as needed for pain.   bimatoprost (LATISSE) 0.03 % ophthalmic solution Apply 1 drop to upper eyelids once daily   chlorpheniramine (CHLOR-TRIMETON) 4 MG tablet Take 1 tablet by mouth 2 (two) times daily.   losartan-hydrochlorothiazide (HYZAAR) 100-25 MG tablet Take 1 tablet by mouth daily.   No facility-administered encounter medications on file as of 05/24/2023.    Allergies (verified) Amlodipine, Aspirin, Citrullus vulgaris, Clindamycin, Codeine, Diphenhydramine hcl (sleep), Loratadine, Metronidazole, Penicillins, Pineapple, and Tramadol   History: Past Medical History:  Diagnosis Date   Allergy As a young adult   Arthritis    Cataract 2019   Chicken pox    Heart murmur Not sure   High cholesterol    Hypertension    Seasonal allergies    Past Surgical History:  Procedure Laterality Date   APPENDECTOMY  1969   AUGMENTATION MAMMAPLASTY     CESAREAN SECTION  1969   Family History  Problem Relation Age of Onset   Cancer Mother    Early death Mother    Hypertension Mother    Alcohol abuse Father    Arthritis Father    Asthma Father    COPD Father    Heart disease Father  Hypertension Father    Asthma Daughter    Arthritis Daughter    Social History   Socioeconomic History   Marital status: Married    Spouse name: Not on file   Number of children: Not on file   Years of education: Not on file   Highest education level: Bachelor's degree (e.g., BA, AB, BS)  Occupational History   Not on file  Tobacco Use   Smoking status: Never   Smokeless tobacco: Never  Vaping Use   Vaping status: Never Used  Substance and Sexual Activity   Alcohol use: Yes    Alcohol/week: 7.0 - 10.0  standard drinks of alcohol    Types: 7 - 10 Shots of liquor per week    Comment: daily   Drug use: No   Sexual activity: Not Currently    Birth control/protection: Abstinence  Other Topics Concern   Not on file  Social History Narrative   Not on file   Social Drivers of Health   Financial Resource Strain: Low Risk  (05/24/2023)   Overall Financial Resource Strain (CARDIA)    Difficulty of Paying Living Expenses: Not hard at all  Food Insecurity: No Food Insecurity (05/24/2023)   Hunger Vital Sign    Worried About Running Out of Food in the Last Year: Never true    Ran Out of Food in the Last Year: Never true  Transportation Needs: No Transportation Needs (05/24/2023)   PRAPARE - Administrator, Civil Service (Medical): No    Lack of Transportation (Non-Medical): No  Physical Activity: Inactive (05/24/2023)   Exercise Vital Sign    Days of Exercise per Week: 0 days    Minutes of Exercise per Session: 10 min  Stress: No Stress Concern Present (05/24/2023)   [employer] of Occupational Health - Occupational Stress Questionnaire    Feeling of Stress : Not at all  Social Connections: Socially Isolated (05/24/2023)   Social Connection and Isolation Panel [NHANES]    Frequency of Communication with Friends and Family: Once a week    Frequency of Social Gatherings with Friends and Family: Once a week    Attends Religious Services: Never    Database administrator or Organizations: No    Attends Engineer, structural: Never    Marital Status: Married    Tobacco Counseling Counseling given: Not Answered   Clinical Intake:  Pre-visit preparation completed: Yes  Pain : 0-10 Pain Score: 5  Pain Type: Chronic pain Pain Descriptors / Indicators: Aching Pain Onset: More than a month ago Pain Frequency: Constant  Nutritional Risks: None Diabetes: No  How often do you need to have someone help you when you read instructions, pamphlets, or other written materials  from your doctor or pharmacy?: 1 - Never  Interpreter Needed?: No  Information entered by :: [employer], CMA   Activities of Daily Living    05/24/2023    3:05 PM 05/12/2023    8:54 AM  In your present state of health, do you have any difficulty performing the following activities:  Hearing? 1 1  Comment wears hearing aids   Vision? 0 0  Difficulty concentrating or making decisions? 0 0  Walking or climbing stairs? 1 1  Dressing or bathing? 0 0  Doing errands, shopping? 0 0  Preparing Food and eating ? N N  Using the Toilet? N N  In the past six months, have you accidently leaked urine? N N  Do you have problems  with loss of bowel control? N N  Managing your Medications? N N  Managing your Finances? N N  Housekeeping or managing your Housekeeping? Y N  Comment husband helps with some housekeeping     Patient Care Team: Copland, Gwenlyn Found, MD as PCP - General (Family Medicine)  Indicate any recent Medical Services you may have received from other than [facility] providers in the past year (date may be approximate).     Assessment:   This is a routine wellness examination for Jennifer Gonzalez.  Hearing/Vision screen No results found.   Goals Addressed   None    Depression Screen    05/24/2023    3:16 PM 05/05/2022    1:44 PM 01/26/2021    2:26 PM 10/20/2020    2:10 PM  PHQ 2/9 Scores  PHQ - 2 Score 0 0 0 0    Fall Risk    05/24/2023    3:12 PM 05/12/2023    8:54 AM 05/04/2022    5:19 PM 01/26/2021    2:29 PM 01/26/2021    1:28 PM  Fall Risk   Falls in the past year? 1 0 0 0 0  Number falls in past yr: 0 0 0 0 0  Injury with Fall? 0 0 0 0 0  Risk for fall due to : No Fall Risks  No Fall Risks    Follow up Falls evaluation completed  Falls evaluation completed Falls prevention discussed     MEDICARE RISK AT HOME: Medicare Risk at Home Any stairs in or around the home?: Yes If so, are there any without handrails?: No Home free of loose throw rugs in walkways, pet beds,  electrical cords, etc?: Yes Adequate lighting in your home to reduce risk of falls?: Yes Life alert?: No Use of a cane, walker or w/c?: No Grab bars in the bathroom?: No Shower chair or bench in shower?: Yes Elevated toilet seat or a handicapped toilet?: Yes (comfort height)  TIMED UP AND GO:  Was the test performed?  No    Cognitive Function:        05/24/2023    3:17 PM 05/05/2022    1:50 PM  6CIT Screen  What Year? 0 points 0 points  What month? 0 points 0 points  What time? 0 points 0 points  Count back from 20 0 points 0 points  Months in reverse 0 points 0 points  Repeat phrase 0 points 4 points  Total Score 0 points 4 points    Immunizations Immunization History  Administered Date(s) Administered   Influenza Split 03/22/1988, 07/15/2012   Janssen (J&J) SARS-COV-2 Vaccination 06/26/2019   Pneumococcal Conjugate-13 03/26/2016   Pneumococcal Polysaccharide-23 07/15/2012, 04/23/2013    TDAP status: Due, Education has been provided regarding the importance of this vaccine. Advised may receive this vaccine at local pharmacy or Health Dept. Aware to provide a copy of the vaccination record if obtained from local pharmacy or Health Dept. Verbalized acceptance and understanding.  Flu Vaccine status: Declined, Education has been provided regarding the importance of this vaccine but patient still declined. Advised may receive this vaccine at local pharmacy or Health Dept. Aware to provide a copy of the vaccination record if obtained from local pharmacy or Health Dept. Verbalized acceptance and understanding.  Pneumococcal vaccine status: Up to date  Covid-19 vaccine status: Information provided on how to obtain vaccines.   Qualifies for Shingles Vaccine? Yes   Zostavax completed No   Shingrix Completed?: No.    Education  has been provided regarding the importance of this vaccine. Patient has been advised to call insurance company to determine out of pocket expense if they  have not yet received this vaccine. Advised may also receive vaccine at local pharmacy or Health Dept. Verbalized acceptance and understanding.  Screening Tests Health Maintenance  Topic Date Due   DTaP/Tdap/Td (1 - Tdap) Never done   Zoster Vaccines- Shingrix (1 of 2) Never done   COVID-19 Vaccine (2 - 2024-25 season) 11/21/2022   Medicare Annual Wellness (AWV)  05/06/2023   Pneumonia Vaccine 63+ Years old  Completed   DEXA SCAN  Completed   Hepatitis C Screening  Completed   HPV VACCINES  Aged Out   INFLUENZA VACCINE  Discontinued    Health Maintenance  Health Maintenance Due  Topic Date Due   DTaP/Tdap/Td (1 - Tdap) Never done   Zoster Vaccines- Shingrix (1 of 2) Never done   COVID-19 Vaccine (2 - 2024-25 season) 11/21/2022   Medicare Annual Wellness (AWV)  05/06/2023    Colorectal cancer screening: No longer required.   Mammogram status: Pt declined.  Bone Density status: Pt declined.  Lung Cancer Screening: (Low Dose CT Chest recommended if Age 27-80 years, 20 pack-year currently smoking OR have quit w/in 15years.) does not qualify.   Additional Screening:  Hepatitis C Screening: does qualify; Completed 03/08/18  Vision Screening: Recommended annual ophthalmology exams for early detection of glaucoma and other disorders of the eye. Is the patient up to date with their annual eye exam?  Yes  Who is the provider or what is the name of the office in which the patient attends annual eye exams? Dr. Nilda Riggs If pt is not established with a provider, would they like to be referred to a provider to establish care? No .   Dental Screening: Recommended annual dental exams for proper oral hygiene  Diabetic Foot Exam: N/a  Community Resource Referral / Chronic Care Management: CRR required this visit?  No   CCM required this visit?  No     Plan:     I have personally reviewed and noted the following in the patient's chart:   Medical and social history Use of  alcohol, tobacco or illicit drugs  Current medications and supplements including opioid prescriptions. Patient is not currently taking opioid prescriptions. Functional ability and status Nutritional status Physical activity Advanced directives List of other physicians Hospitalizations, surgeries, and ER visits in previous 12 months Vitals Screenings to include cognitive, depression, and falls Referrals and appointments  In addition, I have reviewed and discussed with patient certain preventive protocols, quality metrics, and best practice recommendations. A written personalized care plan for preventive services as well as general preventive health recommendations were provided to patient.     Donne Anon, CMA   05/24/2023   After Visit Summary: (MyChart) Due to this being a telephonic visit, the after visit summary with patients personalized plan was offered to patient via MyChart   Nurse Notes: None

## 2023-06-03 ENCOUNTER — Encounter: Payer: Self-pay | Admitting: Medical

## 2023-06-03 ENCOUNTER — Ambulatory Visit: Admitting: Medical

## 2023-06-03 VITALS — BP 148/80 | HR 100 | Resp 100 | Ht 65.0 in | Wt 162.6 lb

## 2023-06-03 DIAGNOSIS — R413 Other amnesia: Secondary | ICD-10-CM | POA: Diagnosis not present

## 2023-06-03 DIAGNOSIS — M2550 Pain in unspecified joint: Secondary | ICD-10-CM

## 2023-06-03 DIAGNOSIS — E538 Deficiency of other specified B group vitamins: Secondary | ICD-10-CM

## 2023-06-03 DIAGNOSIS — G4700 Insomnia, unspecified: Secondary | ICD-10-CM

## 2023-06-03 DIAGNOSIS — I10 Essential (primary) hypertension: Secondary | ICD-10-CM

## 2023-06-03 DIAGNOSIS — R5383 Other fatigue: Secondary | ICD-10-CM

## 2023-06-03 DIAGNOSIS — G629 Polyneuropathy, unspecified: Secondary | ICD-10-CM | POA: Diagnosis not present

## 2023-06-03 DIAGNOSIS — R768 Other specified abnormal immunological findings in serum: Secondary | ICD-10-CM | POA: Diagnosis not present

## 2023-06-03 DIAGNOSIS — R739 Hyperglycemia, unspecified: Secondary | ICD-10-CM

## 2023-06-03 DIAGNOSIS — R7689 Other specified abnormal immunological findings in serum: Secondary | ICD-10-CM

## 2023-06-03 LAB — COMPREHENSIVE METABOLIC PANEL
ALT: 27 U/L (ref 0–35)
AST: 34 U/L (ref 0–37)
Albumin: 4.9 g/dL (ref 3.5–5.2)
Alkaline Phosphatase: 63 U/L (ref 39–117)
BUN: 22 mg/dL (ref 6–23)
CO2: 27 meq/L (ref 19–32)
Calcium: 10.6 mg/dL — ABNORMAL HIGH (ref 8.4–10.5)
Chloride: 102 meq/L (ref 96–112)
Creatinine, Ser: 0.85 mg/dL (ref 0.40–1.20)
GFR: 65.59 mL/min (ref >60.00)
Glucose, Bld: 107 mg/dL — ABNORMAL HIGH (ref 70–99)
Potassium: 4.2 meq/L (ref 3.5–5.1)
Sodium: 142 meq/L (ref 135–145)
Total Bilirubin: 0.5 mg/dL (ref 0.2–1.2)
Total Protein: 7.2 g/dL (ref 6.0–8.3)

## 2023-06-03 LAB — TSH: TSH: 2.23 u[IU]/mL (ref 0.35–5.50)

## 2023-06-03 LAB — CBC WITH DIFFERENTIAL/PLATELET
Basophils Absolute: 0 10*3/uL (ref 0.0–0.1)
Basophils Relative: 0.8 % (ref 0.0–3.0)
Eosinophils Absolute: 0.2 10*3/uL (ref 0.0–0.7)
Eosinophils Relative: 3.4 % (ref 0.0–5.0)
HCT: 40 % (ref 36.0–46.0)
Hemoglobin: 13.6 g/dL (ref 12.0–15.0)
Lymphocytes Relative: 33.4 % (ref 12.0–46.0)
Lymphs Abs: 1.6 10*3/uL (ref 0.7–4.0)
MCHC: 33.9 g/dL (ref 30.0–36.0)
MCV: 100.7 fl — ABNORMAL HIGH (ref 78.0–100.0)
Monocytes Absolute: 0.4 10*3/uL (ref 0.1–1.0)
Monocytes Relative: 8.3 % (ref 3.0–12.0)
Neutro Abs: 2.6 10*3/uL (ref 1.4–7.7)
Neutrophils Relative %: 54.1 % (ref 43.0–77.0)
Platelets: 288 10*3/uL (ref 150.0–400.0)
RBC: 3.97 Mil/uL (ref 3.87–5.11)
RDW: 12.5 % (ref 11.5–15.5)
WBC: 4.8 10*3/uL (ref 4.0–10.5)

## 2023-06-03 LAB — FOLATE: Folate: 2.9 ng/mL — ABNORMAL LOW (ref >5.9)

## 2023-06-03 LAB — SEDIMENTATION RATE: Sed Rate: 26 mm/h (ref 0–30)

## 2023-06-03 LAB — VITAMIN B12: Vitamin B-12: 1102 pg/mL — ABNORMAL HIGH (ref 211–911)

## 2023-06-03 LAB — T4, FREE: Free T4: 0.68 ng/dL (ref 0.60–1.60)

## 2023-06-03 LAB — C-REACTIVE PROTEIN: CRP: <1 mg/dL (ref 0.5–20.0)

## 2023-06-03 NOTE — Patient Instructions (Signed)
 Memory changes Memory changes with short-term recall issues. Differential includes age-related decline and B12 deficiency.  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 Mini mental status test score 25/30. Discussed possible neurologist evaluation and Aricept in future if indicated - Order B12 level and mini mental status test. - Consider referral to neurologist if memory worsens. - Evaluate blood work results before considering Aricept.  Vitamin B12 deficiency Low B12 levels previously noted. B12 deficiency can cause memory issues and neuropathy. Discussed maintaining adequate B12 levels to prevent symptoms. - Order B12 level.   Insomnia Difficulty sleeping can impacts cognition and memory. Discussed melatonin as first-line treatment, trazodone if needed. - Recommend trial of melatonin 2-3 mg. - Consider prescription medication (e.g., trazodone) if melatonin is ineffective.  Osteoarthritis likely. Chronic joint pain, particularly in feet, knees, and hands. Previous x-rays showed mild degenerative changes. Tylenol preferred over NSAIDs. - Order inflammatory labs (ANA, Rheumatoid factor, sed rate, C-reactive protein). - Continue arthritis formula Tylenol. - Consider referral to podiatrist for foot injections if needed.  Hypertension On maximum dose of losartan HCTZ. Discussed white coat hypertension and home monitoring. Chronic hypertension can strain the heart. - Advise purchase of a new blood pressure machine. - Instruct to check blood pressure daily and report readings in 7 days. - Consider medication adjustment based on home blood pressure readings.  General Health Maintenance Diet includes vegetables, low processed foods, and low red meat. Alcohol helps with pain. Limited exercise due to foot pain. - Encourage continuation of healthy diet. - Advise reduction of alcohol intake. - Encourage low-impact exercises as tolerated.  Follow up date to be determined after lab review and your bp update

## 2023-06-03 NOTE — Progress Notes (Signed)
 Subjective:    Patient ID: Jennifer Gonzalez, female    DOB: [birth year], 78 y.o.   MRN: [id number]  HPI  Pt in for first time.   She eats healthy diet. Fruits, vegetable and lean meats.Avoid red meet. 1/2 fresca a day. Non smoker. Drinks alcohol 2 mixed drinks a day. Not much exercise due to feet pain.  Discussed the use of AI scribe software for clinical note transcription with the patient, who gave verbal consent to proceed.  History of Present Illness   Jennifer Gonzalez is a 78 year old female who presents with memory decline and joint pain.  Over the past six months, she has experienced memory changes, primarily affecting her short-term memory. She sometimes forgets if she has had breakfast and occasionally repeats questions shortly after asking them. There are no issues with getting lost while driving or recognizing people's faces and names. Her long-term memory remains intact. She has a family history of memory issues, as her father had Wernicke's encephalopathy due to alcoholism. She is taking B12 supplements, currently at a dose of 2500 mcg, due to previously low B12 levels, which were as low as 148. When her B12 is low, she experiences fatigue and fell asleep during the day years ago.  She experiences significant joint pain, particularly in her feet, knees, and hands, with the left foot being the worst. The pain affects her walking and is described as being across the top of her feet and in her toes. She has had her feet injected in the past, which provided temporary relief. She takes arthritis formula Tylenol, 650 mg, two tablets three times a day, which helps somewhat but does not fully alleviate the pain.  She reports sleep disturbances, sometimes only sleeping a couple of hours a night, although she slept well the previous night. She has not tried melatonin or other sleep aids. She has a history of not being a big sleeper, even as a child.  Her social history includes a healthy diet with  lots of vegetables, low carbohydrates, and limited red meat consumption. She drinks half a Fresca daily and consumes two mixed alcoholic drinks per day. She does not smoke. Due to her foot pain, she no longer walks daily, which has made it difficult to maintain her weight.        Review of Systems  Constitutional:  Positive for fatigue. Negative for chills and fever.  Respiratory:  Negative for cough, choking, wheezing and stridor.   Cardiovascular:  Negative for chest pain and palpitations.  Gastrointestinal:  Negative for abdominal pain.  Musculoskeletal:  Positive for arthralgias. Negative for back pain, joint swelling and myalgias.  Neurological:  Negative for dizziness and headaches.       Memory changes.  Hematological:  Negative for adenopathy. Does not bruise/bleed easily.  Psychiatric/Behavioral:  Positive for sleep disturbance. Negative for behavioral problems and decreased concentration. The patient is not nervous/anxious.    Past Medical History:  Diagnosis Date   Allergy As a young adult   Arthritis    Cataract 2019   Chicken pox    Heart murmur Not sure   High cholesterol    Hypertension    Seasonal allergies      Social History   Socioeconomic History   Marital status: Married    Spouse name: Not on file   Number of children: Not on file   Years of education: Not on file   Highest education level: Bachelor's degree (e.g., BA, AB, BS)  Occupational  History   Not on file  Tobacco Use   Smoking status: Never   Smokeless tobacco: Never  Vaping Use   Vaping status: Never Used  Substance and Sexual Activity   Alcohol use: Yes    Alcohol/week: 7.0 - 10.0 standard drinks of alcohol    Types: 7 - 10 Shots of liquor per week    Comment: daily   Drug use: No   Sexual activity: Not Currently    Birth control/protection: Abstinence  Other Topics Concern   Not on file  Social History Narrative   Not on file   Social Drivers of Health   Financial Resource  Strain: Low Risk  (05/24/2023)   Overall Financial Resource Strain (CARDIA)    Difficulty of Paying Living Expenses: Not hard at all  Food Insecurity: No Food Insecurity (05/24/2023)   Hunger Vital Sign    Worried About Running Out of Food in the Last Year: Never true    Ran Out of Food in the Last Year: Never true  Transportation Needs: No Transportation Needs (05/24/2023)   PRAPARE - Administrator, Civil Service (Medical): No    Lack of Transportation (Non-Medical): No  Physical Activity: Inactive (05/24/2023)   Exercise Vital Sign    Days of Exercise per Week: 0 days    Minutes of Exercise per Session: 10 min  Stress: No Stress Concern Present (05/24/2023)   [employer] of Occupational Health - Occupational Stress Questionnaire    Feeling of Stress : Not at all  Social Connections: Socially Isolated (05/24/2023)   Social Connection and Isolation Panel [NHANES]    Frequency of Communication with Friends and Family: Once a week    Frequency of Social Gatherings with Friends and Family: Once a week    Attends Religious Services: Never    Database administrator or Organizations: No    Attends Banker Meetings: Never    Marital Status: Married  Catering manager Violence: Not At Risk (05/24/2023)   Humiliation, Afraid, Rape, and Kick questionnaire    Fear of Current or Ex-Partner: No    Emotionally Abused: No    Physically Abused: No    Sexually Abused: No    Past Surgical History:  Procedure Laterality Date   APPENDECTOMY  1969   AUGMENTATION MAMMAPLASTY     CESAREAN SECTION  1969    Family History  Problem Relation Age of Onset   Cancer Mother    Early death Mother    Hypertension Mother    Alcohol abuse Father    Arthritis Father    Asthma Father    COPD Father    Heart disease Father    Hypertension Father    Asthma Daughter    Arthritis Daughter     Allergies  Allergen Reactions   Amlodipine     Dizziness    Aspirin Tinitus   Citrullus  Vulgaris Itching   Clindamycin Other (See Comments)    Esophageal inflammation     Codeine Other (See Comments)    Hallucinations     Diphenhydramine Hcl (Sleep) Other (See Comments)    unknown unknown    Loratadine Other (See Comments)    unknown    Metronidazole Itching   Penicillins Itching   Pineapple Itching   Tramadol Other (See Comments)    woozie feeling     Current Outpatient Medications on File Prior to Visit  Medication Sig Dispense Refill   acetaminophen (TYLENOL) 650 MG CR tablet Take 650  mg by mouth 2 (two) times daily as needed for pain.     AMBULATORY NON FORMULARY MEDICATION Medication Name: Propidren (Hair growth supplement and DHT blocker     ascorbic acid (VITAMIN C) 1000 MG tablet Take 1,000 mg by mouth daily.     bimatoprost (LATISSE) 0.03 % ophthalmic solution Apply 1 drop to upper eyelids once daily 3 mL 1   chlorpheniramine (CHLOR-TRIMETON) 4 MG tablet Take 1 tablet by mouth 2 (two) times daily.     Collagen-Vitamin C-Biotin (COLLAGEN PO) Take 11 g by mouth daily.     Cyanocobalamin (B-12 PO) Take 1,500 mcg by mouth daily.     losartan-hydrochlorothiazide (HYZAAR) 100-25 MG tablet Take 1 tablet by mouth daily. 30 tablet 1   Multiple Vitamin (MULTIVITAMIN) capsule Take 1 capsule by mouth daily.     No current facility-administered medications on file prior to visit.    BP (!) 148/80   Pulse 100   Resp (!) 100   Ht 5\' 5"  (1.651 m)   Wt 162 lb 9.6 oz (73.8 kg)   BMI 27.06 kg/m          Objective:   Physical Exam  General Mental Status- Alert. General Appearance- Not in acute distress.   Skin General: Color- Normal Color. Moisture- Normal Moisture.  Neck  No JVD.  Chest and Lung Exam Auscultation: Breath Sounds:-CTA  Cardiovascular Auscultation:Rythm- RRR Murmurs & Other Heart Sounds:Auscultation of the heart reveals- No Murmurs.  Abdomen Inspection:-Inspeection Normal. Palpation/Percussion:Note:No mass. Palpation and  Percussion of the abdomen reveal- Non Tender, Non Distended + BS, no rebound or guarding.   Neurologic CN III-X12 grossly intact.      Assessment & Plan:   Patient Instructions  Memory changes Memory changes with short-term recall issues. Differential includes age-related decline and B12 deficiency.  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 Mini mental status test score 25/30. Discussed possible neurologist evaluation and Aricept in future if indicated - Order B12 level and mini mental status test. - Consider referral to neurologist if memory worsens. - Evaluate blood work results before considering Aricept.  Vitamin B12 deficiency Low B12 levels previously noted. B12 deficiency can cause memory issues and neuropathy. Discussed maintaining adequate B12 levels to prevent symptoms. - Order B12 level.   Insomnia Difficulty sleeping can impacts cognition and memory. Discussed melatonin as first-line treatment, trazodone if needed. - Recommend trial of melatonin 2-3 mg. - Consider prescription medication (e.g., trazodone) if melatonin is ineffective.  Osteoarthritis likely. Chronic joint pain, particularly in feet, knees, and hands. Previous x-rays showed mild degenerative changes. Tylenol preferred over NSAIDs. - Order inflammatory labs (ANA, Rheumatoid factor, sed rate, C-reactive protein). - Continue arthritis formula Tylenol. - Consider referral to podiatrist for foot injections if needed.  Hypertension On maximum dose of losartan HCTZ. Discussed white coat hypertension and home monitoring. Chronic hypertension can strain the heart. - Advise purchase of a new blood pressure machine. - Instruct to check blood pressure daily and report readings in 7 days. - Consider medication adjustment based on home blood pressure readings.  General Health Maintenance Diet includes vegetables, low processed foods, and low red meat. Alcohol helps with pain. Limited exercise due to foot pain. - Encourage  continuation of healthy diet. - Advise reduction of alcohol intake. - Encourage low-impact exercises as tolerated.  Follow up date to be determined after lab review and your bp update   Time spent with patient today was 48  minutes which consisted of chart revdew, discussing diagnosis, work up treatment  and documentation.

## 2023-06-04 ENCOUNTER — Encounter: Payer: Self-pay | Admitting: Medical

## 2023-06-04 NOTE — Addendum Note (Signed)
 Addended by: Gwenevere Abbot on: 06/04/2023 09:03 AM   Modules accepted: Orders

## 2023-06-07 LAB — ANA: Anti Nuclear Antibody (ANA): POSITIVE — AB

## 2023-06-07 LAB — EXTRA SPECIMEN

## 2023-06-07 LAB — VITAMIN B1

## 2023-06-07 LAB — ANTI-NUCLEAR AB-TITER (ANA TITER): ANA Titer 1: 1:40 {titer} — ABNORMAL HIGH

## 2023-06-07 LAB — RHEUMATOID FACTOR: Rheumatoid fact SerPl-aCnc: 12 [IU]/mL (ref <14)

## 2023-06-07 NOTE — Addendum Note (Signed)
 Addended by: Gwenevere Abbot on: 06/07/2023 06:07 AM   Modules accepted: Orders

## 2023-06-08 ENCOUNTER — Encounter: Payer: Self-pay | Admitting: Medical

## 2023-06-08 ENCOUNTER — Other Ambulatory Visit (INDEPENDENT_AMBULATORY_CARE_PROVIDER_SITE_OTHER)

## 2023-06-08 ENCOUNTER — Telehealth: Payer: Self-pay | Admitting: *Deleted

## 2023-06-08 DIAGNOSIS — R5383 Other fatigue: Secondary | ICD-10-CM

## 2023-06-08 DIAGNOSIS — R739 Hyperglycemia, unspecified: Secondary | ICD-10-CM

## 2023-06-08 DIAGNOSIS — G629 Polyneuropathy, unspecified: Secondary | ICD-10-CM | POA: Diagnosis not present

## 2023-06-08 LAB — HEMOGLOBIN A1C: Hgb A1c MFr Bld: 5.4 % (ref 4.6–6.5)

## 2023-06-08 MED ORDER — VALSARTAN-HYDROCHLOROTHIAZIDE 160-25 MG PO TABS: 1.0000 | ORAL_TABLET | Freq: Every day | ORAL | 0 refills | Status: DC

## 2023-06-08 NOTE — Addendum Note (Signed)
 Addended by: Mervin Kung A on: 06/08/2023 01:55 PM   Modules accepted: Orders

## 2023-06-08 NOTE — Addendum Note (Signed)
 Addended by: Gwenevere Abbot on: 06/08/2023 05:55 PM   Modules accepted: Orders

## 2023-06-08 NOTE — Telephone Encounter (Signed)
 FYI  B1 test was not sent out light protected and test was cancelled.    Patient will be back in today for recollect.

## 2023-06-09 LAB — PTH, INTACT AND CALCIUM
Calcium: 9.7 mg/dL (ref 8.6–10.4)
PTH: 64 pg/mL (ref 16–77)

## 2023-06-12 LAB — VITAMIN B1: Vitamin B1 (Thiamine): 7 nmol/L — ABNORMAL LOW (ref 8–30)

## 2023-07-02 ENCOUNTER — Other Ambulatory Visit: Payer: Self-pay | Admitting: Medical

## 2023-08-01 ENCOUNTER — Other Ambulatory Visit: Payer: Self-pay | Admitting: Medical

## 2023-08-03 ENCOUNTER — Other Ambulatory Visit: Payer: Self-pay | Admitting: Medical

## 2023-08-03 ENCOUNTER — Encounter: Payer: Medicare HMO | Admitting: Medical

## 2023-08-03 MED ORDER — VALSARTAN-HYDROCHLOROTHIAZIDE 160-25 MG PO TABS: 1.0000 | ORAL_TABLET | Freq: Every day | ORAL | 0 refills | Status: DC

## 2023-08-22 DIAGNOSIS — M150 Primary generalized (osteo)arthritis: Secondary | ICD-10-CM | POA: Diagnosis not present

## 2023-09-01 ENCOUNTER — Other Ambulatory Visit: Payer: Self-pay | Admitting: Medical

## 2023-09-01 MED ORDER — VALSARTAN-HYDROCHLOROTHIAZIDE 160-25 MG PO TABS: 1.0000 | ORAL_TABLET | Freq: Every day | ORAL | 0 refills | Status: DC

## 2023-09-01 NOTE — Telephone Encounter (Signed)
 Copied from CRM [id number]. Topic: Clinical - Medication Refill >> Sep 01, 2023  8:25 AM Adonis Hoot wrote: Medication: valsartan -hydrochlorothiazide  (DIOVAN -HCT) 160-25 MG tablet  Has the patient contacted their pharmacy? No (Agent: If no, request that the patient contact the pharmacy for the refill. If patient does not wish to contact the pharmacy document the reason why and proceed with request.) (Agent: If yes, when and what did the pharmacy advise?)  This is the patient's preferred pharmacy:  [facility] [id number] - [place], [place] - [address] [place] [address] Phone: [phone] Fax: [phone]  Is this the correct pharmacy for this prescription? Yes If no, delete pharmacy and type the correct one.   Has the prescription been filled recently? Yes  Is the patient out of the medication?no(one week left)  Has the patient been seen for an appointment in the last year OR does the patient have an upcoming appointment? Yes  Can we respond through MyChart? Yes  Agent: Please be advised that Rx refills may take up to 3 business days. We ask that you follow-up with your pharmacy.

## 2023-09-06 ENCOUNTER — Other Ambulatory Visit: Payer: Self-pay | Admitting: Medical

## 2023-09-07 ENCOUNTER — Telehealth: Payer: Self-pay

## 2023-09-07 NOTE — Telephone Encounter (Signed)
 Copied from CRM [id number]. Topic: Clinical - Prescription Issue >> Sep 07, 2023  3:07 PM Baldo Levan wrote: Reason for CRM: Patient is calling in regarding the valsartan -hydrochlorothiazide  (DIOVAN -HCT) 160-25 MG tablet [[id number]], the pharmacy called her and stated she can not get until Saturday but the patient runs out on Friday. Patient is asking if anything can be done about this.

## 2023-09-08 NOTE — Telephone Encounter (Signed)
Appt made for 6/23 

## 2023-09-08 NOTE — Telephone Encounter (Signed)
 Pt called and lvm to return call

## 2023-09-12 ENCOUNTER — Encounter: Payer: Self-pay | Admitting: Medical

## 2023-09-12 ENCOUNTER — Ambulatory Visit (INDEPENDENT_AMBULATORY_CARE_PROVIDER_SITE_OTHER): Admitting: Medical

## 2023-09-12 VITALS — BP 132/78 | HR 99 | Temp 98.4°F | Resp 12 | Ht 65.0 in | Wt 166.2 lb

## 2023-09-12 DIAGNOSIS — I10 Essential (primary) hypertension: Secondary | ICD-10-CM | POA: Diagnosis not present

## 2023-09-12 DIAGNOSIS — R252 Cramp and spasm: Secondary | ICD-10-CM | POA: Diagnosis not present

## 2023-09-12 DIAGNOSIS — R5383 Other fatigue: Secondary | ICD-10-CM

## 2023-09-12 DIAGNOSIS — R7989 Other specified abnormal findings of blood chemistry: Secondary | ICD-10-CM

## 2023-09-12 MED ORDER — VALSARTAN-HYDROCHLOROTHIAZIDE 160-25 MG PO TABS
1.0000 | ORAL_TABLET | Freq: Every day | ORAL | 3 refills | Status: AC
Start: 2023-09-12 — End: ?

## 2023-09-12 NOTE — Patient Instructions (Signed)
 Muscle cramps Intermittent cramps in feet and legs.  Current magnesium intake causes loose stools. Magnesium glycinate considered pending lab results. - Order serum magnesium level and comprehensive metabolic panel. - Advise continuation of current magnesium intake until lab results are available.  Restless Leg Syndrome Symptoms bothersome at night, relieved by magnesium. Discussed gabapentin if symptoms worsen. - Order iron panel. - Consider prescription medication if symptoms worsen.   Hypertension Well-controlled with valsartan  160/25 mg. BP 132/78 mmHg, no side effects. - Prescribe valsartan  160/25 mg, 90 tablets with three refills. - Ensure pharmacy can fill or hold for a month.  Follow-up Follow-up planned to reassess condition and review lab results. - Schedule follow-up appointment in October or sooner if needed

## 2023-09-12 NOTE — Progress Notes (Signed)
 Subjective:    Patient ID: Jennifer Gonzalez, female    DOB: 11/06/1945, 78 y.o.   MRN: [id number]  HPI  Gaylin Osoria is a 78 year old female with hypertension who presents for follow-up on her blood pressure management.  Her blood pressure has been stable with recent readings around 132/78 mmHg. She is currently taking Valsartan  160/25 mg without any side effects.  She experiences muscle cramps in her feet and legs, which she attributes to a long-standing issue. She uses a sports drink called Liquid IV, containing magnesium, to manage her leg cramps. However, it causes loose stools, so she uses it sparingly, measuring one gram per glass of water instead of the full packet. She is unsure of the exact magnesium content in the drink.  She has a history of restless leg syndrome, which is more bothersome at night but does not significantly affect her sleep. She does not take any specific medication for restless leg syndrome but finds that magnesium helps alleviate the symptoms when they become severe.  She reports some degree of fatigue and mentions that she sleeps a lot.        Review of Systems  Constitutional:  Positive for fatigue.       Mild reported.  Respiratory:  Negative for chest tightness, shortness of breath and wheezing.   Cardiovascular:  Negative for chest pain and palpitations.  Gastrointestinal:  Negative for abdominal pain and blood in stool.  Genitourinary:  Negative for dysuria.  Musculoskeletal:  Negative for back pain.       Cramps  Skin:  Negative for rash.  Neurological:  Negative for dizziness, weakness and numbness.  Hematological:  Negative for adenopathy. Does not bruise/bleed easily.  Psychiatric/Behavioral:  Negative for behavioral problems and decreased concentration. The patient is not nervous/anxious.     Past Medical History:  Diagnosis Date   Allergy As a young adult   Arthritis    Cataract 2019   Chicken pox    Heart murmur Not sure   High  cholesterol    Hypertension    Seasonal allergies      Social History   Socioeconomic History   Marital status: Married    Spouse name: Not on file   Number of children: Not on file   Years of education: Not on file   Highest education level: Bachelor's degree (e.g., BA, AB, BS)  Occupational History   Not on file  Tobacco Use   Smoking status: Never   Smokeless tobacco: Never  Vaping Use   Vaping status: Never Used  Substance and Sexual Activity   Alcohol use: Yes    Alcohol/week: 7.0 - 10.0 standard drinks of alcohol    Types: 7 - 10 Shots of liquor per week    Comment: daily   Drug use: No   Sexual activity: Not Currently    Birth control/protection: Abstinence  Other Topics Concern   Not on file  Social History Narrative   Not on file   Social Drivers of Health   Financial Resource Strain: Low Risk  (09/09/2023)   Overall Financial Resource Strain (CARDIA)    Difficulty of Paying Living Expenses: Not hard at all  Food Insecurity: No Food Insecurity (09/09/2023)   Hunger Vital Sign    Worried About Running Out of Food in the Last Year: Never true    Ran Out of Food in the Last Year: Never true  Transportation Needs: No Transportation Needs (09/09/2023)   PRAPARE - Transportation  Lack of Transportation (Medical): No    Lack of Transportation (Non-Medical): No  Physical Activity: Insufficiently Active (09/09/2023)   Exercise Vital Sign    Days of Exercise per Week: 3 days    Minutes of Exercise per Session: 30 min  Stress: No Stress Concern Present (09/09/2023)   [employer] of Occupational Health - Occupational Stress Questionnaire    Feeling of Stress: Not at all  Social Connections: Moderately Isolated (09/09/2023)   Social Connection and Isolation Panel    Frequency of Communication with Friends and Family: Twice a week    Frequency of Social Gatherings with Friends and Family: Once a week    Attends Religious Services: Never    Database administrator  or Organizations: No    Attends Engineer, structural: Not on file    Marital Status: Married  Catering manager Violence: Not At Risk (05/24/2023)   Humiliation, Afraid, Rape, and Kick questionnaire    Fear of Current or Ex-Partner: No    Emotionally Abused: No    Physically Abused: No    Sexually Abused: No    Past Surgical History:  Procedure Laterality Date   APPENDECTOMY  1969   AUGMENTATION MAMMAPLASTY     CESAREAN SECTION  1969    Family History  Problem Relation Age of Onset   Cancer Mother    Early death Mother    Hypertension Mother    Alcohol abuse Father    Arthritis Father    Asthma Father    COPD Father    Heart disease Father    Hypertension Father    Asthma Daughter    Arthritis Daughter     Allergies  Allergen Reactions   Amlodipine     Dizziness    Aspirin Tinitus   Citrullus Vulgaris Itching   Clindamycin Other (See Comments)    Esophageal inflammation     Codeine Other (See Comments)    Hallucinations     Diphenhydramine Hcl (Sleep) Other (See Comments)    unknown unknown    Loratadine Other (See Comments)    unknown    Metronidazole Itching   Penicillins Itching   Pineapple Itching   Tramadol Other (See Comments)    woozie feeling     Current Outpatient Medications on File Prior to Visit  Medication Sig Dispense Refill   acetaminophen (TYLENOL) 650 MG CR tablet Take 650 mg by mouth 2 (two) times daily as needed for pain.     AMBULATORY NON FORMULARY MEDICATION Medication Name: Propidren (Hair growth supplement and DHT blocker     ascorbic acid (VITAMIN C) 1000 MG tablet Take 1,000 mg by mouth daily.     bimatoprost  (LATISSE ) 0.03 % ophthalmic solution Apply 1 drop to upper eyelids once daily 3 mL 1   chlorpheniramine (CHLOR-TRIMETON) 4 MG tablet Take 1 tablet by mouth 2 (two) times daily.     Collagen-Vitamin C-Biotin (COLLAGEN PO) Take 11 g by mouth daily.     Cyanocobalamin  (B-12 PO) Take 1,500 mcg by mouth daily.      Multiple Vitamin (MULTIVITAMIN) capsule Take 1 capsule by mouth daily.     valsartan -hydrochlorothiazide  (DIOVAN -HCT) 160-25 MG tablet TAKE 1 TABLET BY MOUTH DAILY 30 tablet 0   No current facility-administered medications on file prior to visit.    BP 132/78 (BP Location: Right Arm, Patient Position: Sitting, Cuff Size: Normal)   Pulse 99   Temp 98.4 F (36.9 C) (Oral)   Resp 12   Ht 5' 5 (1.651  m)   Wt 166 lb 3.2 oz (75.4 kg)   SpO2 97%   BMI 27.66 kg/m        Objective:   Physical Exam   General Mental Status- Alert. General Appearance- Not in acute distress.   Skin General: Color- Normal Color. Moisture- Normal Moisture.  Neck No JVD.  Chest and Lung Exam Auscultation: Breath Sounds:-Normal.  Cardiovascular Auscultation:Rythm- Regular. Murmurs & Other Heart Sounds:Auscultation of the heart reveals- No Murmurs.  Abdomen Inspection:-Inspeection Normal. Palpation/Percussion:Note:No mass. Palpation and Percussion of the abdomen reveal- Non Tender, Non Distended + BS, no rebound or guarding.   Neurologic Cranial Nerve exam:- CN III-XII intact(No nystagmus), symmetric smile. Strength:- 5/5 equal and symmetric strength both upper and lower extremities.   Lower ext- calfs symmetric, negative homans signs. No edema.     Assessment & Plan:   Muscle cramps Intermittent cramps in feet and legs.  Current magnesium intake causes loose stools. Magnesium glycinate considered pending lab results. - Order serum magnesium level and comprehensive metabolic panel. - Advise continuation of current magnesium intake until lab results are available.  Restless Leg Syndrome Symptoms bothersome at night, relieved by magnesium. Discussed gabapentin if symptoms worsen. - Order iron panel. - Consider prescription medication if symptoms worsen.   Hypertension Well-controlled with valsartan  160/25 mg. BP 132/78 mmHg, no side effects. - Prescribe valsartan  160/25 mg, 90 tablets  with three refills. - Ensure pharmacy can fill or hold for a month.  Follow-up Follow-up planned to reassess condition and review lab results. - Schedule follow-up appointment in October or sooner if needed  Slyvester Latona, PA-C

## 2023-09-13 ENCOUNTER — Ambulatory Visit: Payer: Self-pay | Admitting: Medical

## 2023-09-13 LAB — COMPREHENSIVE METABOLIC PANEL WITH GFR
ALT: 22 U/L (ref 0–35)
AST: 30 U/L (ref 0–37)
Albumin: 4.7 g/dL (ref 3.5–5.2)
Alkaline Phosphatase: 61 U/L (ref 39–117)
BUN: 30 mg/dL — ABNORMAL HIGH (ref 6–23)
CO2: 29 meq/L (ref 19–32)
Calcium: 9.9 mg/dL (ref 8.4–10.5)
Chloride: 100 meq/L (ref 96–112)
Creatinine, Ser: 0.97 mg/dL (ref 0.40–1.20)
GFR: 55.87 mL/min — ABNORMAL LOW (ref >60.00)
Glucose, Bld: 102 mg/dL — ABNORMAL HIGH (ref 70–99)
Potassium: 3.9 meq/L (ref 3.5–5.1)
Sodium: 140 meq/L (ref 135–145)
Total Bilirubin: 0.4 mg/dL (ref 0.2–1.2)
Total Protein: 7.2 g/dL (ref 6.0–8.3)

## 2023-09-13 LAB — IRON,TIBC AND FERRITIN PANEL
%SAT: 37 % (ref 16–45)
Ferritin: 366 ng/mL — ABNORMAL HIGH (ref 16–288)
Iron: 131 ug/dL (ref 45–160)
TIBC: 351 ug/dL (ref 250–450)

## 2023-09-13 LAB — MAGNESIUM: Magnesium: 2 mg/dL (ref 1.5–2.5)

## 2023-09-13 NOTE — Addendum Note (Signed)
 Addended by: DORINA DALLAS HERO on: 09/13/2023 03:45 PM   Modules accepted: Orders

## 2023-09-26 ENCOUNTER — Inpatient Hospital Stay

## 2023-09-26 ENCOUNTER — Inpatient Hospital Stay: Attending: Medical Oncology | Admitting: Medical Oncology

## 2023-09-26 ENCOUNTER — Encounter: Payer: Self-pay | Admitting: Medical Oncology

## 2023-09-26 VITALS — BP 148/83 | HR 103 | Temp 98.2°F | Resp 18 | Ht 65.0 in | Wt 167.0 lb

## 2023-09-26 DIAGNOSIS — R7989 Other specified abnormal findings of blood chemistry: Secondary | ICD-10-CM | POA: Diagnosis present

## 2023-09-26 DIAGNOSIS — Z800 Family history of malignant neoplasm of digestive organs: Secondary | ICD-10-CM | POA: Diagnosis not present

## 2023-09-26 NOTE — Progress Notes (Signed)
 [facility] [phone]   Fax:([phone]) [phone]  INITIAL CONSULT NOTE  Patient Care Team: Saguier, [place] RIGGERS as PCP - General (Internal Medicine)  CHIEF COMPLAINTS/PURPOSE OF CONSULTATION:  Elevated Ferritin  HISTORY OF PRESENTING ILLNESS:  Jennifer Gonzalez 78 y.o. female is referred to our office by their PCP Edward Saguier PA-C for elevated ferritin level:  Recent labs from 09/12/2023 show an iron of 131, iron saturation of 37%, ferritin of 366. CMP showed normal liver enzymes. Last CBC on 06/03/2023 showed a normal WBC of 4.8, Hgb of 13.6, scantly elevated MCV of 100.7 (low folate of 2.9)and normal platelet level of 288.   Patient states that overall she has been well.  Recent elevated ferritin is new to her knowledge Overall fairly health for her years of wisdom She is a retired Engineer, civil (consulting).   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 To her knowledge her body cleared the infection on its own.  No family history of hemochromatosis No autoimmune or inflammatory conditions personally  She does take tylenol 3 times daily for her osteoarthritis of multiple joints. She has noticed more and more trouble with her arthritis over the past year. Her daughter and cousin have rheumatoid arthritis. Patient was referred to rheumatology back in March 2025 for a low positive ANA but her sed rate, CRP and RF were normal.    Mother died young at 93 from pancreatic cancer Father died at 36 and died from ETOH related illnesses She is an only child.   She used to donate plasma 1-2 times per week until around 1979.   No rash, abdominal pain, visual changes, SOB, chest pain. She does report occasional GI upset and some darker stools. She is unsure if she is UTD on her colon cancer screenings.   MEDICAL HISTORY:  Past Medical History:  Diagnosis Date   Allergy As a young adult   Arthritis    Cataract 2019    Chicken pox    Heart murmur Not sure   High cholesterol    Hypertension    Seasonal allergies     SURGICAL HISTORY: Past Surgical History:  Procedure Laterality Date   APPENDECTOMY  1969   AUGMENTATION MAMMAPLASTY     CESAREAN SECTION  1969    SOCIAL HISTORY: Social History   Socioeconomic History   Marital status: Married    Spouse name: Not on file   Number of children: Not on file   Years of education: Not on file   Highest education level: Bachelor's degree (e.g., BA, AB, BS)  Occupational History   Not on file  Tobacco Use   Smoking status: Never   Smokeless tobacco: Never  Vaping Use   Vaping status: Never Used  Substance and Sexual Activity   Alcohol use: Yes    Alcohol/week: 7.0 - 10.0 standard drinks of alcohol    Types: 7 - 10 Shots of liquor per week    Comment: daily   Drug use: No   Sexual activity: Not Currently    Birth control/protection: Abstinence  Other Topics Concern   Not on file  Social History Narrative   Not on file   Social Drivers of Health   Financial Resource Strain: Low Risk  (09/09/2023)   Overall Financial Resource Strain (CARDIA)    Difficulty of Paying Living Expenses: Not hard at all  Food Insecurity: No Food Insecurity (09/26/2023)   Hunger Vital Sign  Worried About Programme researcher, broadcasting/film/video in the Last Year: Never true    Ran Out of Food in the Last Year: Never true  Transportation Needs: No Transportation Needs (09/26/2023)   PRAPARE - Administrator, Civil Service (Medical): No    Lack of Transportation (Non-Medical): No  Physical Activity: Insufficiently Active (09/09/2023)   Exercise Vital Sign    Days of Exercise per Week: 3 days    Minutes of Exercise per Session: 30 min  Stress: No Stress Concern Present (09/09/2023)   [employer] of Occupational Health - Occupational Stress Questionnaire    Feeling of Stress: Not at all  Social Connections: Moderately Isolated (09/09/2023)   Social Connection and  Isolation Panel    Frequency of Communication with Friends and Family: Twice a week    Frequency of Social Gatherings with Friends and Family: Once a week    Attends Religious Services: Never    Database administrator or Organizations: No    Attends Engineer, structural: Not on file    Marital Status: Married  Catering manager Violence: Not At Risk (09/26/2023)   Humiliation, Afraid, Rape, and Kick questionnaire    Fear of Current or Ex-Partner: No    Emotionally Abused: No    Physically Abused: No    Sexually Abused: No    FAMILY HISTORY: Family History  Problem Relation Age of Onset   Cancer Mother    Early death Mother    Hypertension Mother    Alcohol abuse Father    Arthritis Father    Asthma Father    COPD Father    Heart disease Father    Hypertension Father    Asthma Daughter    Arthritis Daughter     ALLERGIES:  is allergic to clindamycin, codeine, diphenhydramine hcl (sleep), amlodipine, aspirin, citrullus vulgaris, metronidazole, penicillins, pineapple, tramadol, and loratadine.  MEDICATIONS:  Current Outpatient Medications  Medication Sig Dispense Refill   acetaminophen (TYLENOL) 650 MG CR tablet Take 650 mg by mouth 2 (two) times daily as needed for pain. (Patient taking differently: Take 650 mg by mouth 3 (three) times daily.)     AMBULATORY NON FORMULARY MEDICATION Medication Name: Propidren (Hair growth supplement and DHT blocker     ascorbic acid (VITAMIN C) 1000 MG tablet Take 1,000 mg by mouth daily.     bimatoprost  (LATISSE ) 0.03 % ophthalmic solution Apply 1 drop to upper eyelids once daily 3 mL 1   chlorpheniramine (CHLOR-TRIMETON) 4 MG tablet Take 1 tablet by mouth 2 (two) times daily.     Collagen-Vitamin C-Biotin (COLLAGEN PO) Take 11 g by mouth daily.     Cyanocobalamin  (B-12 PO) Take 1,500 mcg by mouth daily.     Multiple Vitamin (MULTIVITAMIN) capsule Take 1 capsule by mouth daily.     valsartan -hydrochlorothiazide  (DIOVAN -HCT) 160-25 MG  tablet Take 1 tablet by mouth daily. 90 tablet 3   No current facility-administered medications for this visit.    REVIEW OF SYSTEMS:   Constitutional: ( - ) fevers, ( - )  chills , ( - ) night sweats Eyes: ( - ) blurriness of vision, ( - ) double vision, ( - ) watery eyes Ears, nose, mouth, throat, and face: ( - ) mucositis, ( - ) sore throat Respiratory: ( - ) cough, ( - ) dyspnea, ( - ) wheezes Cardiovascular: ( - ) palpitation, ( - ) chest discomfort, ( - ) lower extremity swelling Gastrointestinal:  ( - ) nausea, ( - )  heartburn, ( - ) change in bowel habits Skin: ( - ) abnormal skin rashes Lymphatics: ( - ) new lymphadenopathy, ( - ) easy bruising Neurological: ( - ) numbness, ( - ) tingling, ( - ) new weaknesses Behavioral/Psych: ( - ) mood change, ( - ) new changes  All other systems were reviewed with the patient and are negative.  PHYSICAL EXAMINATION: ECOG PERFORMANCE STATUS: 1 - Symptomatic but completely ambulatory  Vitals:   09/26/23 1316  BP: (!) 148/83  Pulse: (!) 103  Resp: 18  Temp: 98.2 F (36.8 C)  SpO2: 100%   Filed Weights   09/26/23 1316  Weight: 167 lb (75.8 kg)    GENERAL: well appearing female in NAD  SKIN: skin color, texture, turgor are normal, no rashes or significant lesions EYES: conjunctiva are pink and non-injected, sclera clear OROPHARYNX: no exudate, no erythema; lips, buccal mucosa, and tongue normal. Mild increased size of left parotid gland. Mild edema of the left neck as well.  NECK: supple, non-tender LYMPH:  no palpable lymphadenopathy in the cervical, axillary or supraclavicular lymph nodes.  LUNGS: clear to auscultation and percussion with normal breathing effort HEART: regular rate & rhythm and no murmurs and no lower extremity edema ABDOMEN: soft, non-tender, non-distended, normal bowel sounds Musculoskeletal: no cyanosis of digits and no clubbing  PSYCH: alert & oriented x 3, fluent speech NEURO: no focal motor/sensory  deficits  LABORATORY DATA:  Pending   ASSESSMENT & PLAN Jennifer Gonzalez is a 78 y.o. female who was referred to us  for elevated ferritin level:   We discussed that elevated serum ferritin levels have numerous possible etiologies. These include hereditary hemochromatosis (heterozygous or homozygous), inflammation, liver disease, or iron overload from an exogenous source. Hereditary hemochromatosis is a hereditary condition caused by mutations in the HFE gene, which regulates iron absorption. The most common genes mutated in this condition are the C282Y and H63D genes. Homozygous mutations represent a disease state which requires phlebotomy to decrease ferritin levels to a goal of <50  (Blood (2010) 116 (3): 317-325). The goal is to decrease ferritin so there is no deposition in critical organs (liver, heart, pancreas and thyroid ). Heterozygous mutations (or compound heterozygotes) rarely require phlebotomy, but do have elevated serum iron/ferritin levels.  Ferritin is an acute phase reactant and can be elevated with systemic inflammation. Direct damage to liver tissue can also cause spillage of ferritin into the blood, resulting in elevated ferritin.  Additionally, serum iron levels can be quite transient and an elevation or serum iron may not represent a true overload of total body iron (best lab for this is ferritin).   Review of her history is suggestive more of an inflammatory of liver etiology given her history and labs showing a normal iron and iron saturation level.  HH not likely given this information however if iron saturation trends upwards we can certainly obtain this testing at a later date if needed.  Abdominal US  order placed Reviewed very recent labs- no additional labs needed today I have suggested a referral to GI for her symptoms and history of Hep B. I have also suggested a referral to ENT for her left parotid gland enlargement. She will discuss with her PCP further as well.   #  Elevated Iron/Ferritin Abdominal US  Suggested referral to ENT Suggested referral to GI  RTC 3 months APP, labs (CBC w/, CMP, iron, ferritin, LDH)  All questions were answered. The patient knows to call the clinic with any problems, questions or  concerns.  I have spent a total of 30 minutes minutes of face-to-face and non-face-to-face time, preparing to see the patient, obtaining and/or reviewing separately obtained history, performing a medically appropriate examination, counseling and educating the patient, ordering medications/tests/procedures, referring and communicating with other health care professionals, documenting clinical information in the electronic health record, independently interpreting results and communicating results to the patient, and care coordination.    Lauraine Dais PA-C Department of Hematology/Oncology [facility] at [facility]

## 2023-10-29 ENCOUNTER — Ambulatory Visit (HOSPITAL_BASED_OUTPATIENT_CLINIC_OR_DEPARTMENT_OTHER)
Admission: RE | Admit: 2023-10-29 | Discharge: 2023-10-29 | Disposition: A | Discharge status: Home / Self Care | Source: Ambulatory Visit | Attending: Medical Oncology | Admitting: Medical Oncology

## 2023-10-29 DIAGNOSIS — R7989 Other specified abnormal findings of blood chemistry: Secondary | ICD-10-CM | POA: Diagnosis not present

## 2023-10-29 DIAGNOSIS — K8020 Calculus of gallbladder without cholecystitis without obstruction: Secondary | ICD-10-CM | POA: Diagnosis not present

## 2023-10-29 DIAGNOSIS — K7689 Other specified diseases of liver: Secondary | ICD-10-CM | POA: Diagnosis not present

## 2023-11-03 ENCOUNTER — Telehealth: Payer: Self-pay

## 2023-11-03 ENCOUNTER — Ambulatory Visit: Payer: Self-pay | Admitting: Medical Oncology

## 2023-11-03 NOTE — Telephone Encounter (Signed)
 Spoke with pt no documentation of any one calling her. She was understanding and wanted to know about a ct scan that was done but no results back at the moment  Copied from CRM [id number]. Topic: General - Call Back - No Documentation >> Nov 03, 2023 10:07 AM Suzen RAMAN wrote: Reason for CRM: patient would like a call back. Patient states someone reach out to her yesterday. No notation located in chart and no vm left for patient. Please call patient back if applicable.

## 2023-12-26 ENCOUNTER — Inpatient Hospital Stay: Attending: Medical Oncology

## 2023-12-26 ENCOUNTER — Inpatient Hospital Stay: Admitting: Medical Oncology

## 2023-12-26 VITALS — BP 158/94 | HR 105 | Temp 98.2°F | Resp 18 | Ht 65.0 in | Wt 166.5 lb

## 2023-12-26 DIAGNOSIS — K760 Fatty (change of) liver, not elsewhere classified: Secondary | ICD-10-CM | POA: Insufficient documentation

## 2023-12-26 DIAGNOSIS — M159 Polyosteoarthritis, unspecified: Secondary | ICD-10-CM | POA: Diagnosis not present

## 2023-12-26 DIAGNOSIS — R7989 Other specified abnormal findings of blood chemistry: Secondary | ICD-10-CM

## 2023-12-26 LAB — CBC WITH DIFFERENTIAL (CANCER CENTER ONLY)
Abs Immature Granulocytes: 0.05 K/uL (ref 0.00–0.07)
Basophils Absolute: 0.1 K/uL (ref 0.0–0.1)
Basophils Relative: 1 %
Eosinophils Absolute: 0.2 K/uL (ref 0.0–0.5)
Eosinophils Relative: 3 %
HCT: 37.8 % (ref 36.0–46.0)
Hemoglobin: 12.8 g/dL (ref 12.0–15.0)
Immature Granulocytes: 1 %
Lymphocytes Relative: 29 %
Lymphs Abs: 1.9 K/uL (ref 0.7–4.0)
MCH: 33.3 pg (ref 26.0–34.0)
MCHC: 33.9 g/dL (ref 30.0–36.0)
MCV: 98.4 fL (ref 80.0–100.0)
Monocytes Absolute: 0.6 K/uL (ref 0.1–1.0)
Monocytes Relative: 9 %
Neutro Abs: 3.8 K/uL (ref 1.7–7.7)
Neutrophils Relative %: 57 %
Platelet Count: 255 K/uL (ref 150–400)
RBC: 3.84 MIL/uL — ABNORMAL LOW (ref 3.87–5.11)
RDW: 11.5 % (ref 11.5–15.5)
WBC Count: 6.5 K/uL (ref 4.0–10.5)
nRBC: 0 % (ref 0.0–0.2)

## 2023-12-26 LAB — IRON AND IRON BINDING CAPACITY (CC-WL,HP ONLY)
Iron: 119 ug/dL (ref 28–170)
Saturation Ratios: 29 % (ref 10.4–31.8)
TIBC: 413 ug/dL (ref 250–450)
UIBC: 294 ug/dL

## 2023-12-26 LAB — FERRITIN: Ferritin: 479 ng/mL — ABNORMAL HIGH (ref 11–307)

## 2023-12-26 LAB — CMP (CANCER CENTER ONLY)
ALT: 27 U/L (ref 0–44)
AST: 44 U/L — ABNORMAL HIGH (ref 15–41)
Albumin: 4.6 g/dL (ref 3.5–5.0)
Alkaline Phosphatase: 69 U/L (ref 38–126)
Anion gap: 14 (ref 5–15)
BUN: 26 mg/dL — ABNORMAL HIGH (ref 8–23)
CO2: 26 mmol/L (ref 22–32)
Calcium: 10 mg/dL (ref 8.9–10.3)
Chloride: 102 mmol/L (ref 98–111)
Creatinine: 0.97 mg/dL (ref 0.44–1.00)
GFR, Estimated: 59 mL/min — ABNORMAL LOW (ref >60)
Glucose, Bld: 128 mg/dL — ABNORMAL HIGH (ref 70–99)
Potassium: 4.2 mmol/L (ref 3.5–5.1)
Sodium: 141 mmol/L (ref 135–145)
Total Bilirubin: 0.3 mg/dL (ref 0.0–1.2)
Total Protein: 7.1 g/dL (ref 6.5–8.1)

## 2023-12-26 LAB — LACTATE DEHYDROGENASE: LDH: 188 U/L (ref 98–192)

## 2023-12-26 NOTE — Progress Notes (Signed)
 Hematology and Oncology Follow Up Visit  Jennifer Gonzalez [id number] [birth year] 78 y.o. 12/26/2023  Past Medical History:  Diagnosis Date   Allergy As a young adult   Arthritis    Cataract 2019   Chicken pox    Heart murmur Not sure   High cholesterol    Hypertension    Seasonal allergies     Principle Diagnosis:  Elevated Ferritin Suspected to be secondary to chronic liver disease  Current Therapy:   Observation     Interim History:  Jennifer Gonzalez is back for follow-up for elevated ferritin level:    Labs from 09/12/2023 show an iron of 131, iron saturation of 37%, ferritin of 366. CMP showed normal liver enzymes. Last CBC on 06/03/2023 showed a normal WBC of 4.8, Hgb of 13.6, scantly elevated MCV of 100.7 (low folate of 2.9)and normal platelet level of 288.  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 To her knowledge her body cleared the infection on its own.   She had a liver US  performed on 11/03/2023 showing fatty liver disease  Today she states that she has been well. She has no concerns  No abdominal pains.   There has been no bleeding to her knowledge: denies epistaxis, gingivitis, hemoptysis, hematemesis, hematuria, melena, excessive bruising, blood donation.   She is working on some diet changes following the US  report. She is also looking into seeing a GI specialist.   Wt Readings from Last 3 Encounters:  12/26/23 166 lb 8 oz (75.5 kg)  09/26/23 167 lb (75.8 kg)  09/12/23 166 lb 3.2 oz (75.4 kg)     Medications:   Current Outpatient Medications:    acetaminophen (TYLENOL) 650 MG CR tablet, Take 650 mg by mouth 2 (two) times daily as needed for pain. (Patient taking differently: Take 650 mg by mouth 3 (three) times daily.), Disp: , Rfl:    AMBULATORY NON FORMULARY MEDICATION, Medication Name: Propidren (Hair growth supplement and DHT blocker, Disp: , Rfl:    ascorbic acid (VITAMIN C) 1000 MG tablet,  Take 1,000 mg by mouth daily., Disp: , Rfl:    bimatoprost  (LATISSE ) 0.03 % ophthalmic solution, Apply 1 drop to upper eyelids once daily, Disp: 3 mL, Rfl: 1   chlorpheniramine (CHLOR-TRIMETON) 4 MG tablet, Take 1 tablet by mouth 2 (two) times daily., Disp: , Rfl:    Collagen-Vitamin C-Biotin (COLLAGEN PO), Take 11 g by mouth daily., Disp: , Rfl:    Cyanocobalamin  (B-12 PO), Take 1,500 mcg by mouth daily., Disp: , Rfl:    Multiple Vitamin (MULTIVITAMIN) capsule, Take 1 capsule by mouth daily., Disp: , Rfl:    valsartan -hydrochlorothiazide  (DIOVAN -HCT) 160-25 MG tablet, Take 1 tablet by mouth daily., Disp: 90 tablet, Rfl: 3  Allergies:  Allergies  Allergen Reactions   Clindamycin Other (See Comments)    Esophageal inflammation     Codeine Other (See Comments)    Hallucinations     Diphenhydramine Hcl (Sleep) Itching and Swelling    Swelling of lips    Amlodipine Other (See Comments)    Dizziness    Aspirin Tinitus   Citrullus Vulgaris Itching   Metronidazole Itching   Penicillins Itching   Pineapple Itching   Tramadol Other (See Comments)    woozie feeling    Loratadine Other (See Comments)    unknown     Past Medical History, Surgical history, Social history, and Family History were reviewed and updated.  Review of Systems: Review of Systems  Constitutional: Negative.   HENT:  Negative.    Eyes: Negative.   Respiratory: Negative.    Cardiovascular: Negative.   Gastrointestinal: Negative.   Endocrine: Negative.   Genitourinary: Negative.    Musculoskeletal: Negative.   Skin: Negative.   Neurological: Negative.   Hematological: Negative.   Psychiatric/Behavioral: Negative.       Physical Exam:  height is 5' 5 (1.651 m) and weight is 166 lb 8 oz (75.5 kg). Her oral temperature is 98.2 F (36.8 C). Her blood pressure is 158/94 (abnormal) and her pulse is 105 (abnormal). Her respiration is 18 and oxygen saturation is 100%.   Physical Exam General:  NAD Cardiovascular: regular rate and rhythm Pulmonary: clear ant fields Abdomen: soft, nontender, + bowel sounds GU: no suprapubic tenderness Extremities: no edema, no joint deformities Skin: no rashes Neurological: Weakness but otherwise nonfocal   Lab Results  Component Value Date   WBC 4.8 06/03/2023   HGB 13.6 06/03/2023   HCT 40.0 06/03/2023   MCV 100.7 (H) 06/03/2023   PLT 288.0 06/03/2023     Chemistry      Component Value Date/Time   NA 140 09/12/2023 1620   K 3.9 09/12/2023 1620   CL 100 09/12/2023 1620   CO2 29 09/12/2023 1620   BUN 30 (H) 09/12/2023 1620   CREATININE 0.97 09/12/2023 1620   CREATININE 1.31 (H) 01/21/2020 1425      Component Value Date/Time   CALCIUM 9.9 09/12/2023 1620   ALKPHOS 61 09/12/2023 1620   AST 30 09/12/2023 1620   ALT 22 09/12/2023 1620   BILITOT 0.4 09/12/2023 1620     Encounter Diagnoses  Name Primary?   Elevated ferritin Yes   Fatty liver     Assessment and Plan- Patient is a 78 y.o. female who was referred to us  for elevated ferritin which was felt to be secondary to chronic liver dysfunction. She has a normal iron saturation and no family history of hemochromatosis. She has no history of autoimmune disease but does have osteoarthritis of multiple joints. She has no history of hypercalcemia, CKD, chronic cbc abnormalities.   Labs pending at time of visit We will plan to see her again in about 6 months to ensure labs are stable and then we will follow her once every year as her PCP sees her every year in the fall/winter She declines BP recheck today- home BP around 140/80  Disposition: RTC 6 months APP, labs(CBC, CMP, iron, ferritin)   Jennifer Dais PA-C 10/6/20252:43 PM

## 2024-05-24 ENCOUNTER — Ambulatory Visit

## 2024-06-25 ENCOUNTER — Inpatient Hospital Stay: Attending: Medical Oncology

## 2024-06-25 ENCOUNTER — Ambulatory Visit: Admitting: Medical Oncology
